# Patient Record
Sex: Male | Born: 1935 | Race: Black or African American | Hispanic: No | Marital: Married | Smoking: Former smoker
Health system: Southern US, Community
[De-identification: ages and names within clinical notes are randomized; demographics above are authoritative.]

## PROBLEM LIST (undated history)

## (undated) DIAGNOSIS — I10 Essential (primary) hypertension: Secondary | ICD-10-CM

## (undated) DIAGNOSIS — H548 Legal blindness, as defined in USA: Secondary | ICD-10-CM

## (undated) DIAGNOSIS — H409 Unspecified glaucoma: Secondary | ICD-10-CM

## (undated) DIAGNOSIS — E119 Type 2 diabetes mellitus without complications: Secondary | ICD-10-CM

## (undated) DIAGNOSIS — I639 Cerebral infarction, unspecified: Secondary | ICD-10-CM

## (undated) HISTORY — PX: CATARACT EXTRACTION: SUR2

---

## 2006-03-13 ENCOUNTER — Emergency Department: Payer: Self-pay | Admitting: Emergency Medicine

## 2006-03-13 ENCOUNTER — Other Ambulatory Visit: Payer: Self-pay

## 2006-11-30 ENCOUNTER — Emergency Department: Payer: Self-pay | Admitting: Emergency Medicine

## 2006-11-30 ENCOUNTER — Other Ambulatory Visit: Payer: Self-pay

## 2006-12-07 ENCOUNTER — Emergency Department: Payer: Self-pay | Admitting: Unknown Physician Specialty

## 2006-12-07 ENCOUNTER — Other Ambulatory Visit: Payer: Self-pay

## 2006-12-11 ENCOUNTER — Ambulatory Visit: Payer: Self-pay | Admitting: Unknown Physician Specialty

## 2016-04-29 ENCOUNTER — Emergency Department: Payer: Medicare Other

## 2016-04-29 ENCOUNTER — Encounter: Payer: Self-pay | Admitting: Emergency Medicine

## 2016-04-29 ENCOUNTER — Emergency Department
Admission: EM | Admit: 2016-04-29 | Discharge: 2016-04-29 | Disposition: A | Discharge status: Home / Self Care | Payer: Medicare Other | Attending: Emergency Medicine | Admitting: Emergency Medicine

## 2016-04-29 DIAGNOSIS — Y9389 Activity, other specified: Secondary | ICD-10-CM | POA: Diagnosis not present

## 2016-04-29 DIAGNOSIS — Z87891 Personal history of nicotine dependence: Secondary | ICD-10-CM | POA: Insufficient documentation

## 2016-04-29 DIAGNOSIS — E119 Type 2 diabetes mellitus without complications: Secondary | ICD-10-CM | POA: Diagnosis not present

## 2016-04-29 DIAGNOSIS — S0083XA Contusion of other part of head, initial encounter: Secondary | ICD-10-CM

## 2016-04-29 DIAGNOSIS — I10 Essential (primary) hypertension: Secondary | ICD-10-CM | POA: Diagnosis not present

## 2016-04-29 DIAGNOSIS — M542 Cervicalgia: Secondary | ICD-10-CM | POA: Insufficient documentation

## 2016-04-29 DIAGNOSIS — S0001XA Abrasion of scalp, initial encounter: Secondary | ICD-10-CM

## 2016-04-29 DIAGNOSIS — S0990XA Unspecified injury of head, initial encounter: Secondary | ICD-10-CM | POA: Diagnosis present

## 2016-04-29 DIAGNOSIS — W06XXXA Fall from bed, initial encounter: Secondary | ICD-10-CM | POA: Insufficient documentation

## 2016-04-29 DIAGNOSIS — Y92009 Unspecified place in unspecified non-institutional (private) residence as the place of occurrence of the external cause: Secondary | ICD-10-CM | POA: Diagnosis not present

## 2016-04-29 DIAGNOSIS — Y999 Unspecified external cause status: Secondary | ICD-10-CM | POA: Diagnosis not present

## 2016-04-29 HISTORY — DX: Type 2 diabetes mellitus without complications: E11.9

## 2016-04-29 HISTORY — DX: Essential (primary) hypertension: I10

## 2016-04-29 HISTORY — DX: Unspecified glaucoma: H40.9

## 2016-04-29 HISTORY — DX: Cerebral infarction, unspecified: I63.9

## 2016-04-29 HISTORY — DX: Legal blindness, as defined in USA: H54.8

## 2016-04-29 MED ORDER — TETANUS-DIPHTH-ACELL PERTUSSIS 5-2.5-18.5 LF-MCG/0.5 IM SUSP
0.5000 mL | Freq: Once | INTRAMUSCULAR | Status: AC
  Administered 2016-04-29: 0.5 mL via INTRAMUSCULAR
  Filled 2016-04-29: qty 0.5

## 2016-04-29 NOTE — ED Provider Notes (Signed)
[facility] Emergency Department Provider Note  ____________________________________________  Time seen: Approximately 4:48 PM  I have reviewed the triage vital signs and the nursing notes.   HISTORY  Chief Complaint Fall; Head Injury; and Neck Pain Level 5 caveat:  Portions of the history and physical were unable to be obtained due to the patient's poor historian   HPI Tyrone Hudson is a 82 y.o. male brought to the ED due to unwitnessed fall. He was getting out of his hospital bed at home when he had an apparent fall. He yelled out and his wife went to his side and found that he had fallen and suffered an abrasion to his forehead. Patient complains only of pain in the neck.     Past Medical History:  Diagnosis Date  . Diabetes mellitus without complication (HCC)   . Glaucoma   . Hypertension   . Legally blind   . Stroke [facility](HCC)      There are no active problems to display for this patient.    Past Surgical History:  Procedure Laterality Date  . CATARACT EXTRACTION       Prior to Admission medications   Not on File  No blood thinners   Allergies Benadryl [diphenhydramine hcl (sleep)]   History reviewed. No pertinent family history.  Social History Social History  Substance Use Topics  . Smoking status: Former Games developermoker  . Smokeless tobacco: Never Used  . Alcohol use No    Review of Systems  Constitutional:   No fever or chills.  ENT:   No sore throat. No rhinorrhea. Cardiovascular:   No chest pain. Respiratory:   No dyspnea or cough. Gastrointestinal:   Negative for abdominal pain, vomiting and diarrhea.  Genitourinary:   Negative for dysuria or difficulty urinating. Musculoskeletal:   Positive neck pain Neurological:   Positive forehead pain 10-point ROS otherwise negative.  ____________________________________________   PHYSICAL EXAM:  VITAL SIGNS: ED Triage Vitals [04/29/16 1509]  Enc Vitals Group     BP (!)  161/75     Pulse Rate 70     Resp 20     Temp 97.7 F (36.5 C)     Temp Source Oral     SpO2 98 %     Weight 165 lb (74.8 kg)     Height 5\' 6"  (1.676 m)     Head Circumference      Peak Flow      Pain Score      Pain Loc      Pain Edu?      Excl. in GC?     Vital signs reviewed, nursing assessments reviewed.   Constitutional:   Alert and oriented. Well appearing and in no distress. I doubt bedbugs, the patient's pajamas Eyes:   No scleral icterus. No conjunctival pallor. PERRL. EOMI.  No nystagmus. ENT   Head:   Normocephalic with superficial abrasion in the midline over the forehead.   Nose:   No congestion/rhinnorhea. No septal hematoma. There is a small 1-2 mm superficial laceration over the bridge of the nose, not gaping, hemostatic. Not requiring repair   Mouth/Throat:   MMM, no pharyngeal erythema. No peritonsillar mass.    Neck:   No stridor. No SubQ emphysema. No meningismus. Diffuse midline tenderness Hematological/Lymphatic/Immunilogical:   No cervical lymphadenopathy. Cardiovascular:   RRR. Symmetric bilateral radial and DP pulses.  No murmurs.  Respiratory:   Normal respiratory effort without tachypnea nor retractions. Breath sounds are clear and equal bilaterally. No  wheezes/rales/rhonchi. Gastrointestinal:   Soft and nontender. Non distended. There is no CVA tenderness.  No rebound, rigidity, or guarding. Genitourinary:   deferred Musculoskeletal:   Nontender with normal range of motion in all extremities. No joint effusions.  No lower extremity tenderness.  No edema. Positive midline cervical spine tenderness. No other spinal tenderness. Neurologic:   Normal speech and language.  CN 2-10 normal. Motor grossly intact. No gross focal neurologic deficits are appreciated.  Skin:    Skin is warm, dry and intact. No rash noted.  No petechiae, purpura, or bullae.  ____________________________________________    LABS (pertinent positives/negatives) (all  labs ordered are listed, but only abnormal results are displayed) Labs Reviewed - No data to display ____________________________________________   EKG  Interpreted by me Sinus rhythm rate of 71, normal axis and intervals. Normal QRS ST segments and T waves. There are voltage criteria for LVH in the high lateral leads.  ____________________________________________    RADIOLOGY  CT head unremarkable CT cervical spine unremarkable  ____________________________________________   PROCEDURES Procedures  ____________________________________________   INITIAL IMPRESSION / ASSESSMENT AND PLAN / ED COURSE  Pertinent labs & imaging results that were available during my care of the patient were reviewed by me and considered in my medical decision making (see chart for details).  Patient well appearing no acute distress. Presents with isolated head trauma with some neck pain. With this location of injury there is a risk of extension injury to the C-spine. Collar placed. CT ordered.    ----------------------------------------- 5:23 PM on 04/29/2016 -----------------------------------------  Workup negative. Patient at baseline. We'll discharge home follow up with primary care.   Clinical Course    ____________________________________________   FINAL CLINICAL IMPRESSION(S) / ED DIAGNOSES  Final diagnoses:  Contusion of face, initial encounter  Abrasion of scalp, initial encounter       Portions of this note were generated with dragon dictation software. Dictation errors may occur despite best attempts at proofreading.    Sharman CheekPhillip Makelle Marrone, MD 04/29/16 1723

## 2016-04-29 NOTE — ED Triage Notes (Signed)
Pt presents to ED via POV. Per pt's wife pt got out of bed, had a "dizzy spell" and fell. Pt's wife reports she found him face down on the floor. Pt presents with abrasion to the top of his head, one of which covered with abx ointment.

## 2016-04-29 NOTE — ED Notes (Signed)
Patient and care giver given instructions regarding follow-up.  Patient's care giver verbalized understanding.

## 2016-04-29 NOTE — Discharge Instructions (Signed)
Your CT scans of the head and neck were unremarkable. Follow up with your primary care doctor for continued monitoring of your symptoms.

## 2019-07-18 ENCOUNTER — Inpatient Hospital Stay
Admission: EM | Admit: 2019-07-18 | Discharge: 2019-07-21 | DRG: 871 | Disposition: A | Discharge status: Hospice | Payer: Medicare Other | Attending: Internal Medicine | Admitting: Internal Medicine

## 2019-07-18 ENCOUNTER — Emergency Department: Payer: Medicare Other

## 2019-07-18 ENCOUNTER — Other Ambulatory Visit: Payer: Self-pay

## 2019-07-18 DIAGNOSIS — I10 Essential (primary) hypertension: Secondary | ICD-10-CM | POA: Diagnosis present

## 2019-07-18 DIAGNOSIS — A419 Sepsis, unspecified organism: Secondary | ICD-10-CM | POA: Diagnosis present

## 2019-07-18 DIAGNOSIS — H409 Unspecified glaucoma: Secondary | ICD-10-CM | POA: Diagnosis present

## 2019-07-18 DIAGNOSIS — Z515 Encounter for palliative care: Secondary | ICD-10-CM | POA: Diagnosis present

## 2019-07-18 DIAGNOSIS — Z7189 Other specified counseling: Secondary | ICD-10-CM | POA: Diagnosis not present

## 2019-07-18 DIAGNOSIS — Z87891 Personal history of nicotine dependence: Secondary | ICD-10-CM | POA: Diagnosis not present

## 2019-07-18 DIAGNOSIS — N390 Urinary tract infection, site not specified: Secondary | ICD-10-CM | POA: Diagnosis present

## 2019-07-18 DIAGNOSIS — R64 Cachexia: Secondary | ICD-10-CM | POA: Diagnosis present

## 2019-07-18 DIAGNOSIS — R6521 Severe sepsis with septic shock: Secondary | ICD-10-CM | POA: Diagnosis present

## 2019-07-18 DIAGNOSIS — H9190 Unspecified hearing loss, unspecified ear: Secondary | ICD-10-CM | POA: Diagnosis present

## 2019-07-18 DIAGNOSIS — H548 Legal blindness, as defined in USA: Secondary | ICD-10-CM | POA: Diagnosis present

## 2019-07-18 DIAGNOSIS — U071 COVID-19: Secondary | ICD-10-CM | POA: Diagnosis present

## 2019-07-18 DIAGNOSIS — R7881 Bacteremia: Secondary | ICD-10-CM | POA: Diagnosis not present

## 2019-07-18 DIAGNOSIS — R6520 Severe sepsis without septic shock: Secondary | ICD-10-CM | POA: Diagnosis not present

## 2019-07-18 DIAGNOSIS — A4159 Other Gram-negative sepsis: Principal | ICD-10-CM | POA: Diagnosis present

## 2019-07-18 DIAGNOSIS — D696 Thrombocytopenia, unspecified: Secondary | ICD-10-CM | POA: Diagnosis not present

## 2019-07-18 DIAGNOSIS — Z8673 Personal history of transient ischemic attack (TIA), and cerebral infarction without residual deficits: Secondary | ICD-10-CM

## 2019-07-18 DIAGNOSIS — D6959 Other secondary thrombocytopenia: Secondary | ICD-10-CM | POA: Diagnosis present

## 2019-07-18 DIAGNOSIS — B961 Klebsiella pneumoniae [K. pneumoniae] as the cause of diseases classified elsewhere: Secondary | ICD-10-CM

## 2019-07-18 DIAGNOSIS — I248 Other forms of acute ischemic heart disease: Secondary | ICD-10-CM | POA: Diagnosis present

## 2019-07-18 DIAGNOSIS — Z66 Do not resuscitate: Secondary | ICD-10-CM | POA: Diagnosis present

## 2019-07-18 DIAGNOSIS — N179 Acute kidney failure, unspecified: Secondary | ICD-10-CM | POA: Diagnosis present

## 2019-07-18 DIAGNOSIS — E119 Type 2 diabetes mellitus without complications: Secondary | ICD-10-CM | POA: Diagnosis present

## 2019-07-18 DIAGNOSIS — R000 Tachycardia, unspecified: Secondary | ICD-10-CM | POA: Diagnosis not present

## 2019-07-18 LAB — BLOOD CULTURE ID PANEL (REFLEXED)

## 2019-07-18 LAB — COMPREHENSIVE METABOLIC PANEL
ALT: 28 U/L (ref 0–44)
AST: 56 U/L — ABNORMAL HIGH (ref 15–41)
Albumin: 3.6 g/dL (ref 3.5–5.0)
Alkaline Phosphatase: 74 U/L (ref 38–126)
Anion gap: 18 — ABNORMAL HIGH (ref 5–15)
BUN: 19 mg/dL (ref 8–23)
CO2: 25 mmol/L (ref 22–32)
Calcium: 9.2 mg/dL (ref 8.9–10.3)
Chloride: 100 mmol/L (ref 98–111)
Creatinine, Ser: 2.17 mg/dL — ABNORMAL HIGH (ref 0.61–1.24)
GFR calc Af Amer: 29 mL/min — ABNORMAL LOW (ref >60)
GFR calc non Af Amer: 25 mL/min — ABNORMAL LOW (ref >60)
Glucose, Bld: 169 mg/dL — ABNORMAL HIGH (ref 70–99)
Potassium: 4.3 mmol/L (ref 3.5–5.1)
Sodium: 143 mmol/L (ref 135–145)
Total Bilirubin: 0.8 mg/dL (ref 0.3–1.2)
Total Protein: 7.3 g/dL (ref 6.5–8.1)

## 2019-07-18 LAB — URINALYSIS, COMPLETE (UACMP) WITH MICROSCOPIC
Bilirubin Urine: NEGATIVE
Glucose, UA: NEGATIVE mg/dL
Ketones, ur: NEGATIVE mg/dL
Nitrite: NEGATIVE
Protein, ur: 30 mg/dL — AB
RBC / HPF: >50 RBC/hpf — ABNORMAL HIGH (ref 0–5)
Specific Gravity, Urine: 1.006 (ref 1.005–1.030)
WBC, UA: >50 WBC/hpf — ABNORMAL HIGH (ref 0–5)
pH: 6 (ref 5.0–8.0)

## 2019-07-18 LAB — CBC WITH DIFFERENTIAL/PLATELET
Abs Immature Granulocytes: 0.01 10*3/uL (ref 0.00–0.07)
Basophils Absolute: 0 10*3/uL (ref 0.0–0.1)
Basophils Relative: 0 %
Eosinophils Absolute: 0 10*3/uL (ref 0.0–0.5)
Eosinophils Relative: 0 %
HCT: 47.1 % (ref 39.0–52.0)
Hemoglobin: 15 g/dL (ref 13.0–17.0)
Immature Granulocytes: 0 %
Lymphocytes Relative: 16 %
Lymphs Abs: 0.5 10*3/uL — ABNORMAL LOW (ref 0.7–4.0)
MCH: 30.1 pg (ref 26.0–34.0)
MCHC: 31.8 g/dL (ref 30.0–36.0)
MCV: 94.6 fL (ref 80.0–100.0)
Monocytes Absolute: 0 10*3/uL — ABNORMAL LOW (ref 0.1–1.0)
Monocytes Relative: 0 %
Neutro Abs: 2.7 10*3/uL (ref 1.7–7.7)
Neutrophils Relative %: 84 %
Platelets: 84 10*3/uL — ABNORMAL LOW (ref 150–400)
RBC: 4.98 MIL/uL (ref 4.22–5.81)
RDW: 13.7 % (ref 11.5–15.5)
Smear Review: NORMAL
WBC: 3.2 10*3/uL — ABNORMAL LOW (ref 4.0–10.5)
nRBC: 0 % (ref 0.0–0.2)

## 2019-07-18 LAB — TROPONIN I (HIGH SENSITIVITY)
Troponin I (High Sensitivity): 956 ng/L (ref <18)
Troponin I (High Sensitivity): 1065 ng/L (ref <18)
Troponin I (High Sensitivity): 1110 ng/L (ref <18)

## 2019-07-18 LAB — LACTIC ACID, PLASMA
Lactic Acid, Venous: >11 mmol/L (ref 0.5–1.9)
Lactic Acid, Venous: 9.4 mmol/L (ref 0.5–1.9)
Lactic Acid, Venous: 8.5 mmol/L (ref 0.5–1.9)
Lactic Acid, Venous: 7.3 mmol/L (ref 0.5–1.9)

## 2019-07-18 LAB — PROTIME-INR
INR: 1.2 (ref 0.8–1.2)
Prothrombin Time: 14.6 seconds (ref 11.4–15.2)

## 2019-07-18 LAB — MRSA PCR SCREENING: MRSA by PCR: NEGATIVE

## 2019-07-18 LAB — PROCALCITONIN: Procalcitonin: 26.52 ng/mL

## 2019-07-18 MED ORDER — ACETAMINOPHEN 650 MG RE SUPP: 650.0000 mg | Freq: Four times a day (QID) | RECTAL | Status: DC | PRN

## 2019-07-18 MED ORDER — LACTATED RINGERS IV BOLUS (SEPSIS)
1000.0000 mL | Freq: Once | INTRAVENOUS | Status: AC
  Administered 2019-07-18: 1000 mL via INTRAVENOUS

## 2019-07-18 MED ORDER — HEPARIN SODIUM (PORCINE) 5000 UNIT/ML IJ SOLN
5000.0000 [IU] | Freq: Three times a day (TID) | INTRAMUSCULAR | Status: DC
  Administered 2019-07-18: 22:00:00 5000 [IU] via SUBCUTANEOUS
  Filled 2019-07-18: qty 1

## 2019-07-18 MED ORDER — LACTATED RINGERS IV SOLN: INTRAVENOUS | Status: DC

## 2019-07-18 MED ORDER — CHLORHEXIDINE GLUCONATE CLOTH 2 % EX PADS
6.0000 | MEDICATED_PAD | Freq: Every day | CUTANEOUS | Status: DC
  Administered 2019-07-18 – 2019-07-19 (×2): 6 via TOPICAL

## 2019-07-18 MED ORDER — NOREPINEPHRINE 4 MG/250ML-% IV SOLN
0.0000 ug/min | INTRAVENOUS | Status: DC
  Administered 2019-07-18: 26 ug/min via INTRAVENOUS
  Administered 2019-07-18: 22:00:00 28 ug/min via INTRAVENOUS
  Administered 2019-07-18: 16:00:00 5 ug/min via INTRAVENOUS
  Administered 2019-07-19: 26 ug/min via INTRAVENOUS
  Administered 2019-07-19: 04:00:00 24 ug/min via INTRAVENOUS
  Filled 2019-07-18 (×5): qty 250
  Filled 2019-07-18: qty 500

## 2019-07-18 MED ORDER — LACTATED RINGERS IV BOLUS
1000.0000 mL | Freq: Once | INTRAVENOUS | Status: AC
  Administered 2019-07-18: 14:00:00 1000 mL via INTRAVENOUS

## 2019-07-18 MED ORDER — SODIUM CHLORIDE 0.9 % IV SOLN
2.0000 g | INTRAVENOUS | Status: DC
  Administered 2019-07-19: 06:00:00 2 g via INTRAVENOUS
  Filled 2019-07-18: qty 20

## 2019-07-18 MED ORDER — ACETAMINOPHEN 650 MG RE SUPP
650.0000 mg | Freq: Once | RECTAL | Status: AC
  Administered 2019-07-18: 13:00:00 650 mg via RECTAL
  Filled 2019-07-18: qty 1

## 2019-07-18 MED ORDER — SODIUM CHLORIDE 0.9 % IV SOLN
1.0000 g | INTRAVENOUS | Status: DC
  Administered 2019-07-18: 12:00:00 1 g via INTRAVENOUS
  Filled 2019-07-18 (×2): qty 10

## 2019-07-18 MED ORDER — METOPROLOL TARTRATE 5 MG/5ML IV SOLN
5.0000 mg | Freq: Once | INTRAVENOUS | Status: AC
  Administered 2019-07-18: 14:00:00 5 mg via INTRAVENOUS
  Filled 2019-07-18: qty 5

## 2019-07-18 MED ORDER — METOPROLOL TARTRATE 5 MG/5ML IV SOLN
5.0000 mg | Freq: Once | INTRAVENOUS | Status: AC
  Administered 2019-07-18: 5 mg via INTRAVENOUS
  Filled 2019-07-18: qty 5

## 2019-07-18 MED ORDER — LACTATED RINGERS IV BOLUS (SEPSIS)
1000.0000 mL | Freq: Once | INTRAVENOUS | Status: AC
Start: 2019-07-18 — End: 2019-07-18
  Administered 2019-07-18: 12:00:00 1000 mL via INTRAVENOUS

## 2019-07-18 MED ORDER — LACTATED RINGERS IV BOLUS
1000.0000 mL | Freq: Once | INTRAVENOUS | Status: AC
  Administered 2019-07-18: 1000 mL via INTRAVENOUS

## 2019-07-18 MED ORDER — ACETAMINOPHEN 325 MG PO TABS: 650.0000 mg | ORAL_TABLET | Freq: Four times a day (QID) | ORAL | Status: DC | PRN

## 2019-07-18 MED ORDER — LACTATED RINGERS IV BOLUS (SEPSIS)
500.0000 mL | Freq: Once | INTRAVENOUS | Status: AC
  Administered 2019-07-18: 13:00:00 500 mL via INTRAVENOUS

## 2019-07-18 NOTE — ED Notes (Signed)
Ceftriaxone full dose given

## 2019-07-18 NOTE — ED Provider Notes (Signed)
[facility] Emergency Department Provider Note    First MD Initiated Contact with Patient 07/18/19 1119     (approximate)  I have reviewed the triage vital signs and the nursing notes.   HISTORY  Chief Complaint Altered Mental Status and Weakness  Level V Caveat:  AMS  HPI Tyrone Hudson is a 86 y.o. male plosive past medical history presents to the ER with altered mental status.  Per family and EMS report patient has been increasingly confused with past several days.  Has not had anything to eat or drink in 2-3.  Reportedly having difficulty swallowing for the past several.  Patient also reporting burning when he urinates over the past 2 to 3 days.  Arrives to the ER critically ill-appearing tachycardic frail and almost cachectic appearing    Past Medical History:  Diagnosis Date  . Diabetes mellitus without complication (HCC)   . Glaucoma   . Hypertension   . Legally blind   . Stroke [facility])    No family history on file. Past Surgical History:  Procedure Laterality Date  . CATARACT EXTRACTION     There are no problems to display for this patient.     Prior to Admission medications   Not on File    Allergies Benadryl [diphenhydramine hcl (sleep)]    Social History Social History   Tobacco Use  . Smoking status: Former Games developer  . Smokeless tobacco: Never Used  Substance Use Topics  . Alcohol use: No  . Drug use: No    Review of Systems Patient denies headaches, rhinorrhea, blurry vision, numbness, shortness of breath, chest pain, edema, cough, abdominal pain, nausea, vomiting, diarrhea, dysuria, fevers, rashes or hallucinations unless otherwise stated above in HPI. ____________________________________________   PHYSICAL EXAM:  VITAL SIGNS: Vitals:   07/18/19 1346 07/18/19 1348  BP: (!) 87/64 (!) 89/66  Pulse: (!) 137 (!) 136  Resp: (!) 24 (!) 24  Temp:    SpO2: 99% 99%    Constitutional: enecphalopathic Eyes:  Conjunctivae are normal.  Head: Atraumatic. Nose: No congestion/rhinnorhea. Mouth/Throat: Mucous membranes are moist.   Neck: No stridor. Painless ROM.  Cardiovascular: tachycardic, regular rhythm. Grossly normal heart sounds.  Delayed cap refill Respiratory: tachypnea, coarse bibasilar breath sounds. Gastrointestinal: Soft and nontender. No distention. No abdominal bruits. No CVA tenderness. Genitourinary: normal external genitalia Musculoskeletal: No lower extremity tenderness nor edema.  No joint effusions. Neurologic:   Unable to cooperate with neuro exam. No facial droop Skin:  Skin is cool dry and intact. No rash noted. Psychiatric: unable to assess  ____________________________________________   LABS (all labs ordered are listed, but only abnormal results are displayed)  Results for orders placed or performed during the hospital encounter of 07/18/19 (from the past 24 hour(s))  Comprehensive metabolic panel     Status: Abnormal   Collection Time: 07/18/19 11:20 AM  Result Value Ref Range   Sodium 143 135 - 145 mmol/L   Potassium 4.3 3.5 - 5.1 mmol/L   Chloride 100 98 - 111 mmol/L   CO2 25 22 - 32 mmol/L   Glucose, Bld 169 (H) 70 - 99 mg/dL   BUN 19 8 - 23 mg/dL   Creatinine, Ser 5.88 (H) 0.61 - 1.24 mg/dL   Calcium 9.2 8.9 - 50.2 mg/dL   Total Protein 7.3 6.5 - 8.1 g/dL   Albumin 3.6 3.5 - 5.0 g/dL   AST 56 (H) 15 - 41 U/L   ALT 28 0 - 44 U/L  Alkaline Phosphatase 74 38 - 126 U/L   Total Bilirubin 0.8 0.3 - 1.2 mg/dL   GFR calc non Af Amer 25 (L) >60 mL/min   GFR calc Af Amer 29 (L) >60 mL/min   Anion gap 18 (H) 5 - 15  CBC with Differential     Status: Abnormal   Collection Time: 07/18/19 11:20 AM  Result Value Ref Range   WBC 3.2 (L) 4.0 - 10.5 K/uL   RBC 4.98 4.22 - 5.81 MIL/uL   Hemoglobin 15.0 13.0 - 17.0 g/dL   HCT 33.2 95.1 - 88.4 %   MCV 94.6 80.0 - 100.0 fL   MCH 30.1 26.0 - 34.0 pg   MCHC 31.8 30.0 - 36.0 g/dL   RDW 16.6 06.3 - 01.6 %   Platelets  84 (L) 150 - 400 K/uL   nRBC 0.0 0.0 - 0.2 %   Neutrophils Relative % 84 %   Neutro Abs 2.7 1.7 - 7.7 K/uL   Lymphocytes Relative 16 %   Lymphs Abs 0.5 (L) 0.7 - 4.0 K/uL   Monocytes Relative 0 %   Monocytes Absolute 0.0 (L) 0.1 - 1.0 K/uL   Eosinophils Relative 0 %   Eosinophils Absolute 0.0 0.0 - 0.5 K/uL   Basophils Relative 0 %   Basophils Absolute 0.0 0.0 - 0.1 K/uL   WBC Morphology MORPHOLOGY UNREMARKABLE    Smear Review Normal platelet morphology    Immature Granulocytes 0 %   Abs Immature Granulocytes 0.01 0.00 - 0.07 K/uL   Burr Cells PRESENT   Urinalysis, Complete w Microscopic     Status: Abnormal   Collection Time: 07/18/19 11:20 AM  Result Value Ref Range   Color, Urine YELLOW (A) YELLOW   APPearance HAZY (A) CLEAR   Specific Gravity, Urine 1.006 1.005 - 1.030   pH 6.0 5.0 - 8.0   Glucose, UA NEGATIVE NEGATIVE mg/dL   Hgb urine dipstick LARGE (A) NEGATIVE   Bilirubin Urine NEGATIVE NEGATIVE   Ketones, ur NEGATIVE NEGATIVE mg/dL   Protein, ur 30 (A) NEGATIVE mg/dL   Nitrite NEGATIVE NEGATIVE   Leukocytes,Ua LARGE (A) NEGATIVE   RBC / HPF >50 (H) 0 - 5 RBC/hpf   WBC, UA >50 (H) 0 - 5 WBC/hpf   Bacteria, UA MANY (A) NONE SEEN   Squamous Epithelial / LPF 0-5 0 - 5   WBC Clumps PRESENT    Mucus PRESENT   Procalcitonin     Status: None   Collection Time: 07/18/19 11:20 AM  Result Value Ref Range   Procalcitonin 26.52 ng/mL  Lactic acid, plasma     Status: Abnormal   Collection Time: 07/18/19 12:21 PM  Result Value Ref Range   Lactic Acid, Venous >11.0 (HH) 0.5 - 1.9 mmol/L  Protime-INR     Status: None   Collection Time: 07/18/19 12:21 PM  Result Value Ref Range   Prothrombin Time 14.6 11.4 - 15.2 seconds   INR 1.2 0.8 - 1.2   ____________________________________________  EKG My review and personal interpretation at Time: 11:26   Indication: sepsis  Rate: 160  Rhythm: sinus tach Axis: normal Other: non specific st abn, no stemi  ____________________________________________  RADIOLOGY  I personally reviewed all radiographic images ordered to evaluate for the above acute complaints and reviewed radiology reports and findings.  These findings were personally discussed with the patient.  Please see medical record for radiology report.  ____________________________________________   PROCEDURES  Procedure(s) performed:  .Critical Care Performed by: Willy Eddy, MD  Authorized by: Merlyn Lot, MD   Critical care provider statement:    Critical care time (minutes):  40   Critical care was necessary to treat or prevent imminent or life-threatening deterioration of the following conditions:  Shock   Critical care was time spent personally by me on the following activities:  Discussions with consultants, evaluation of patient's response to treatment, examination of patient, ordering and performing treatments and interventions, ordering and review of laboratory studies, ordering and review of radiographic studies, pulse oximetry, re-evaluation of patient's condition, obtaining history from patient or surrogate and review of old charts      Critical Care performed: yes ____________________________________________   INITIAL IMPRESSION / ASSESSMENT AND PLAN / ED COURSE  Pertinent labs & imaging results that were available during my care of the patient were reviewed by me and considered in my medical decision making (see chart for details).   DDX: Dehydration, sepsis, pna, uti, hypoglycemia, cva, drug effect, withdrawal, encephalitis   Tyrone Hudson is a 86 y.o. who presents to the ED with symptoms as described above.  Patient critically ill-appearing frail appears septic.  Medical be sent for by differential.  Will order IV fluid for resuscitation.  Will order IV antibiotics.  His urine does appear grossly purulent.  Clinical Course as of Jul 17 1399  Sat Jul 18, 2019  1218 Tachycardia is improving.   Patient has grossly infected urine.  Does have evidence of AKI.  Will order CT to exclude septic stone   [PR]  1251 Patient reassessed.  Patient with evidence of septic shock.  Heart rate is improving.  Blood pressure stabilized.  He seems a little bit better perfused.  Discussed poor prognosis with the wife at bedside.  States that he would not want CPR or to be placed on life support.  CODE STATUS changed to DNR but they do want to continue with IV fluids and IV antibiotics but want to make sure that the goal is comfort.   [PR]  1359 We will continue with IV fluids.  Family agrees with plan for conservative management but agrees to IV fluids and IV antibiotics.  Patient discussed with hospitalist.  Prognosis remains exceedingly poor.   [PR]    Clinical Course User Index [PR] Merlyn Lot, MD    The patient was evaluated in Emergency Department today for the symptoms described in the history of present illness. He/she was evaluated in the context of the global COVID-19 pandemic, which necessitated consideration that the patient might be at risk for infection with the SARS-CoV-2 virus that causes COVID-19. Institutional protocols and algorithms that pertain to the evaluation of patients at risk for COVID-19 are in a state of rapid change based on information released by regulatory bodies including the CDC and federal and state organizations. These policies and algorithms were followed during the patient's care in the ED.  As part of my medical decision making, I reviewed the following data within the [place] notes reviewed and incorporated, Labs reviewed, notes from prior ED visits and [place] Controlled Substance Database   ____________________________________________   FINAL CLINICAL IMPRESSION(S) / ED DIAGNOSES  Final diagnoses:  Sepsis with acute organ dysfunction and septic shock, due to unspecified organism, unspecified type ([place])      NEW MEDICATIONS  STARTED DURING THIS VISIT:  New Prescriptions   No medications on file     Note:  This document was prepared using Dragon voice recognition software and may include unintentional dictation errors.  Willy Eddy, MD 07/18/19 [id number]

## 2019-07-18 NOTE — ED Notes (Signed)
Lab called and stated to recollect blur top and gray top - completed at this time

## 2019-07-18 NOTE — ED Notes (Signed)
Admitting provider to bedside and discussed comfort care with spouse - spouse still undecided and changing her mind and talking to family - admitting provider will be placing orders for lactated ringer bolus and levophed

## 2019-07-18 NOTE — ED Notes (Signed)
Dr Roxan Hockey notified of critical lactic acid of >11.0 - no new orders at this time

## 2019-07-18 NOTE — Progress Notes (Signed)
CODE SEPSIS - PHARMACY COMMUNICATION  **Broad Spectrum Antibiotics should be administered within 1 hour of Sepsis diagnosis**  Time Code Sepsis Called/Page Received: 1128  Antibiotics Ordered: CTX   Time of 1st antibiotic administration: 1159  Additional action taken by pharmacy: n/a   Tressie Ellis  Pharmacy Resident 07/18/2019  11:50 AM

## 2019-07-18 NOTE — H&P (Signed)
History and Physical    Tyrone Hudson KVQ:[id number] DOB: [birth year] DOA: 07/18/2019    [facility]   Patient coming from: Home  Chief Complaint: Severe Sepsis.  HPI: Tyrone Hudson is a 86 y.o. male with medical history significant of DM II / HTN / stroke  With wife at bedside, Wife says he did not tell her he was feeling bad. But he has been eating poorly for about a week. He has not bee able to swallow a month and not take meds in apple sauce. Per wife he needs total care at home. Pt has an aid at home.  ED Course:  Blood pressure (!) 82/68, pulse (!) 118, temperature 99.9 F (37.7 C), temperature source Axillary, resp. rate (!) 21, height 5\' 7"  (1.702 m), weight 81.6 kg, SpO2 96 %. In Ed pt received 2 and half liter bolus of ivf and rocephin. Pt also received metoprolol prn for his tachycardia.   Review of Systems: As per HPI otherwise 10 point review of systems negative.    Past Medical History:  Diagnosis Date  . Diabetes mellitus without complication ([place])   . Glaucoma   . Hypertension   . Legally blind   . Stroke [facility])     Past Surgical History:  Procedure Laterality Date  . CATARACT EXTRACTION       reports that he has quit smoking. He has never used smokeless tobacco. He reports that he does not drink alcohol or use drugs.  Allergies  Allergen Reactions  . Benadryl [Diphenhydramine Hcl (Sleep)] Rash    History reviewed. No pertinent family history.    Prior to Admission medications   Not on File    Physical Exam: Vitals:   07/18/19 1645 07/18/19 1700 07/18/19 1715 07/18/19 1800  BP: (!) 79/56 (!) 89/56 (!) 84/56 (!) 82/68  Pulse: (!) 120 (!) 123 (!) 120 (!) 118  Resp:      Temp:    99.9 F (37.7 C)  TempSrc:    Axillary  SpO2: 100% 100% (!) 89% 96%  Weight:      Height:    5\' 7"  (1.702 m)      Constitutional: NAD, calm, comfortable Vitals:   07/18/19 1645 07/18/19 1700 07/18/19 1715 07/18/19 1800  BP: (!) 79/56  (!) 89/56 (!) 84/56 (!) 82/68  Pulse: (!) 120 (!) 123 (!) 120 (!) 118  Resp:      Temp:    99.9 F (37.7 C)  TempSrc:    Axillary  SpO2: 100% 100% (!) 89% 96%  Weight:      Height:    5\' 7"  (1.702 m)   Eyes: EOMI, bl opacification of both lenses, blind due tot end stage glaucoma. ENMT: Mucous membranes are dry. Posterior pharynx clear of any exudate or lesions.edentulous Neck: normal, supple, no masses, no thyromegaly Respiratory: clear to auscultation bilaterally, no wheezing, no crackles. Normal respiratory effort. No accessory muscle use.  Cardiovascular: Regular rate and rhythm, no murmurs / rubs / gallops. No extremity edema. 2+ pedal pulses. No carotid bruits.  Abdomen: no tenderness, no masses palpated. No hepatosplenomegaly. Bowel sounds positive.  Musculoskeletal: no clubbing / cyanosis. No joint deformity upper and lower extremities. Good ROM, no contractures. Normal muscle tone.  Skin: no rashes, lesions, ulcers. No induration Neurologic: CN 2-12 grossly intact. Pt moving all four ext.  Psychiatric:  Pt is alert and follows commands.  Labs on Admission: I have personally reviewed following labs and imaging studies  CBC: Recent Labs  Lab  07/18/19 1120  WBC 3.2*  NEUTROABS 2.7  HGB 15.0  HCT 47.1  MCV 94.6  PLT 84*   Basic Metabolic Panel: Recent Labs  Lab 07/18/19 1120  NA 143  K 4.3  CL 100  CO2 25  GLUCOSE 169*  BUN 19  CREATININE 2.17*  CALCIUM 9.2   GFR: Estimated Creatinine Clearance: 21.7 mL/min (A) (by C-G formula based on SCr of 2.17 mg/dL (H)). Liver Function Tests: Recent Labs  Lab 07/18/19 1120  AST 56*  ALT 28  ALKPHOS 74  BILITOT 0.8  PROT 7.3  ALBUMIN 3.6   No results for input(s): LIPASE, AMYLASE in the last 168 hours. No results for input(s): AMMONIA in the last 168 hours. Coagulation Profile: Recent Labs  Lab 07/18/19 1221  INR 1.2   Cardiac Enzymes: No results for input(s): CKTOTAL, CKMB, CKMBINDEX, TROPONINI in the last  168 hours. BNP (last 3 results) No results for input(s): PROBNP in the last 8760 hours. HbA1C: No results for input(s): HGBA1C in the last 72 hours. CBG: No results for input(s): GLUCAP in the last 168 hours. Lipid Profile: No results for input(s): CHOL, HDL, LDLCALC, TRIG, CHOLHDL, LDLDIRECT in the last 72 hours. Thyroid Function Tests: No results for input(s): TSH, T4TOTAL, FREET4, T3FREE, THYROIDAB in the last 72 hours. Anemia Panel: No results for input(s): VITAMINB12, FOLATE, FERRITIN, TIBC, IRON, RETICCTPCT in the last 72 hours. Urine analysis:    Component Value Date/Time   COLORURINE YELLOW (A) 07/18/2019 1120   APPEARANCEUR HAZY (A) 07/18/2019 1120   LABSPEC 1.006 07/18/2019 1120   PHURINE 6.0 07/18/2019 1120   GLUCOSEU NEGATIVE 07/18/2019 1120   HGBUR LARGE (A) 07/18/2019 1120   BILIRUBINUR NEGATIVE 07/18/2019 1120   KETONESUR NEGATIVE 07/18/2019 1120   PROTEINUR 30 (A) 07/18/2019 1120   NITRITE NEGATIVE 07/18/2019 1120   LEUKOCYTESUR LARGE (A) 07/18/2019 1120    Radiological Exams on Admission: DG Chest Portable 1 View  Result Date: 07/18/2019 CLINICAL DATA:  Cough, weakness, altered mental status EXAM: PORTABLE CHEST 1 VIEW COMPARISON:  None. FINDINGS: Normal heart size. Normal mediastinal contour. No pneumothorax. No pleural effusion. Lungs appear clear, with no acute consolidative airspace disease and no pulmonary edema. IMPRESSION: No active disease. Electronically Signed   By: Delbert Phenix M.D.   On: 07/18/2019 11:57   CT Renal Stone Study  Result Date: 07/18/2019 CLINICAL DATA:  Flank pain with kidney stone suspected EXAM: CT ABDOMEN AND PELVIS WITHOUT CONTRAST TECHNIQUE: Multidetector CT imaging of the abdomen and pelvis was performed following the standard protocol without IV contrast. COMPARISON:  None. FINDINGS: Lower chest: Gynecomastia on both sides. Emphysematous changes with reticulation in the lower lungs that is likely atelectasis or scarring. Coronary  calcification. Hepatobiliary: No focal liver abnormality.No evidence of biliary obstruction or stone. Pancreas: Unremarkable. Spleen: Unremarkable. Adrenals/Urinary Tract: Negative adrenals. No hydronephrosis or stone. Symmetric smooth renal atrophy. Moderate distension of the bladder. Stomach/Bowel: No obstruction. No evidence of inflammation. Mild distal colonic diverticulosis. Vascular/Lymphatic: No acute vascular abnormality. No mass or adenopathy. Reproductive:Marked prostate enlargement which is globular appearing. Presumed retraction of the right testicle into the inguinal canal, the scrotum is not completely covered. Other: No ascites or pneumoperitoneum. Motion degraded to the degree that findings could easily be obscured. Musculoskeletal: No acute abnormalities. Spondylosis and facet spurring. IMPRESSION: 1. Motion degraded CT without acute finding. No hydronephrosis or stone. 2. Marked enlargement of the prostate. Electronically Signed   By: Marnee Spring M.D.   On: 07/18/2019 13:41    EKG:  Assessment/Plan Principal Problem:   Severe sepsis (HCC) Pt has received total of 4.5 L of ivf rehydration BP was initially responding to ivf and now has become resistant and family and wife is uncertain about comfort care and children want everything to be done , we have therefore started pt on levophed thru peripheral iv. D/W wife at bedside ms betty that his [rognosis is guarded and will not recover from this most likely due to age and risk factors.  Pt to continue on levophed at 5-10 to keep MAP of 65.  Cultures obtained.  LR continued. NPO as pt is at risk for aspiration.  Indwelling foley as he has a large prostate.   Home meds held for his bp and dm as he is npo since past month and has swallowing issue per wife. Pt is unstable and if he survives this hospitalization we will proceed with swallow study.   DVT prophylaxis: Heparin.  Code Status: DNR Family Communication: Tyrone Hudson Wife [phone] Disposition Plan: TBD Consults called: None Admission status: Inpatient.  Gertha Calkin MD [facility] If 7PM-7AM, please contact night-coverage [email or web address] Password [facility]  07/18/2019, 6:09 PM

## 2019-07-18 NOTE — Progress Notes (Signed)
Pt's troponin has increased from 956 to 1065, no complaints of chest pain.  EKG obtained which shows borderline ST elevation in leads V2-V3, and minimal ST depression in lateral leads.  Will place on heparin drip.  Page sent out to Dr. Elease Hashimoto with Cardiology.  Awaiting callback.   Harlon Ditty, AGACNP-BC [place] [facility] Pager: [phone]

## 2019-07-18 NOTE — ED Notes (Signed)
Pt spouse requested this nurse to speak with her son - advised son of pt condition Applied ice packs to bilat underarms and groin - one sheet left to cover pt

## 2019-07-18 NOTE — ED Notes (Signed)
Pt drank cup of water 

## 2019-07-18 NOTE — ED Notes (Signed)
Admitting doctor request cooling blankets - advised provider that ED did not have these but could apply ice to groin and underarms and remove blankets - also gave VO for tylenol supp in 2 hours - admitting provider ask this nurse to tell the pt spouse that he was doing poorly and to ask her if she wanted the pt to be comfort care - discussed pt condition with spouse and she would like a few minutes to discuss POC with family

## 2019-07-18 NOTE — ED Notes (Signed)
Admitting provider messaged that pt temp was 103.4 rectally even after tylenol supp

## 2019-07-18 NOTE — Progress Notes (Signed)
Attempt was made to place Left Femoral CVC.  Able to successfully cannulate left femoral vein, however unable to thread guidewire.  Angle of entry was changed (successfully cannulated vessel) but still unable to thread guidewire.  Therefore attempt was aborted.  Pressure held at the site.  No signs of complications, Vital signs remain unchanged.  Continue to monitor.  Will assess for alternative access.   Harlon Ditty, AGACNP-BC [place] [facility] Pager: [phone]

## 2019-07-18 NOTE — ED Notes (Signed)
Pt BP appears hypotensive at this time - Dr Roxan Hockey notified

## 2019-07-18 NOTE — Progress Notes (Signed)
PHARMACY - PHYSICIAN COMMUNICATION CRITICAL VALUE ALERT - BLOOD CULTURE IDENTIFICATION (BCID)  Tyrone Hudson is an 86 y.o. male who presented to [facility] on 07/18/2019 with a chief complaint of failure to thrive.  Assessment:  Lab reports 4 of 4 bottles + for Klebsiella pneumoniae  Name of physician (or Provider) Contacted: Leanord Asal, NP  Current antibiotics: Rocephin 1gm IV q24hrs  Changes to prescribed antibiotics recommended: Increase to Rocephin 2gm per 24hrs Recommendations accepted by provider  Results for orders placed or performed during the hospital encounter of 07/18/19  Blood Culture ID Panel (Reflexed) (Collected: 07/18/2019 11:38 AM)  Result Value Ref Range   Enterococcus species NOT DETECTED NOT DETECTED   Listeria monocytogenes NOT DETECTED NOT DETECTED   Staphylococcus species NOT DETECTED NOT DETECTED   Staphylococcus aureus (BCID) NOT DETECTED NOT DETECTED   Streptococcus species NOT DETECTED NOT DETECTED   Streptococcus agalactiae NOT DETECTED NOT DETECTED   Streptococcus pneumoniae NOT DETECTED NOT DETECTED   Streptococcus pyogenes NOT DETECTED NOT DETECTED   Acinetobacter baumannii NOT DETECTED NOT DETECTED   Enterobacteriaceae species DETECTED (A) NOT DETECTED   Enterobacter cloacae complex NOT DETECTED NOT DETECTED   Escherichia coli NOT DETECTED NOT DETECTED   Klebsiella oxytoca NOT DETECTED NOT DETECTED   Klebsiella pneumoniae DETECTED (A) NOT DETECTED   Proteus species NOT DETECTED NOT DETECTED   Serratia marcescens NOT DETECTED NOT DETECTED   Carbapenem resistance NOT DETECTED NOT DETECTED   Haemophilus influenzae NOT DETECTED NOT DETECTED   Neisseria meningitidis NOT DETECTED NOT DETECTED   Pseudomonas aeruginosa NOT DETECTED NOT DETECTED   Candida albicans NOT DETECTED NOT DETECTED   Candida glabrata NOT DETECTED NOT DETECTED   Candida krusei NOT DETECTED NOT DETECTED   Candida parapsilosis NOT DETECTED NOT DETECTED   Candida tropicalis NOT  DETECTED NOT DETECTED    Valrie Hart A 07/18/2019  11:51 PM

## 2019-07-18 NOTE — Progress Notes (Signed)
Called and updated pt's wife Urho Rio on his status, that he is currently requiring high dose of Levophed running through PIV, and that he needs central line placement to safely infuse.  She gives consent for placement of central line.  Also updated her that pt's troponin is increasing, and will need Cardiology to evaluation.  She is appreciative of update.    Harlon Ditty, AGACNP-BC [place] [facility] Pager: [phone]

## 2019-07-18 NOTE — ED Triage Notes (Signed)
Pt arrived via EMS from home for report of weakness and altered mental status x2-3 days - pt is normally able to speak, A&O x4 - PT IS BLIND - family reports that pt has been "unable to swallow x2-3 months" so has not had any medications x2-3 months and they report no intake x2-3 days - urine reports to be concentrated - pt tachcardia and tachypnea

## 2019-07-18 NOTE — ED Notes (Signed)
VO for lactated ringers bolus obtained from Dr Roxan Hockey

## 2019-07-18 NOTE — ED Notes (Signed)
Attempted to call report and was advised that this pt is not stable enough for the regular floor - admitting provider messaged

## 2019-07-18 NOTE — Consult Note (Addendum)
 Name: Tyrone Hudson MRN: [id number] DOB: 08/13/1923    ADMISSION DATE:  07/18/2019 CONSULTATION DATE:  07/18/2019  REFERRING MD :  Elizabeth Ouma, NP   CHIEF COMPLAINT:  Hypotension  BRIEF PATIENT DESCRIPTION:  86 y.o. Male, DNR/DNI, admitted with septic shock secondary to UTI and Klebsiella pneumoniae bacteremia requiring vasopressors, AKI, and elevated troponin.  Cardiology consulted. Pt also is positive for COVID-19.  SIGNIFICANT EVENTS  2/20- Admitted to Stepdown unit 2/20- Hypotensive despite IVF resuscitation, vasopressors started 2/20- PCCM consulted 2/20- Elevated troponin, EKG with borderline ST elevation in V2-V3, Cardiology consulted  STUDIES:  2/20- CT Renal Stone Study>> 1. Motion degraded CT without acute finding. No hydronephrosis or Stone. 2. Marked enlargement of the prostate.  CULTURES: SARS-CoV-2 PCR 2/20>>POSITIVE Blood 2/20>>Klebsiella pneumoniae 4/4 bottles Urine 2/20>> MRSA PCR 2/20>>  ANTIBIOTICS: Ceftriaxone 2/20>>  HISTORY OF PRESENT ILLNESS:   Tyrone Hudson is a 86-year-old male with a past medical history notable for stroke, hypertension, diabetes mellitus 2 who presented to [facility] ED on 07/18/2019 due to poor p.o. intake for about a week.  His wife reports he has not been able to swallow well for about a month, and that he is requiring total care at home she has an aide.  Upon presentation to the ED his blood pressure was 82/68, heart rate 118, temperature 99.9, respiratory rate 21 and SPO2 96%.  Initial work-up in the ED revealed glucose 169, BUN 19, creatinine 2.17, anion gap 18, AST 56, lactic acid greater than 11, procalcitonin 26.52, WBC 3.2 (with lymphocytopenia), platelets 84, INR 1.2, prothrombin time 14.6.  His urinalysis is positive for urinary tract infection.  Chest x-ray was negative, CT renal stone study was negative.  He met sepsis criteria therefore he received IV fluid resuscitation and broad-spectrum antibiotics.  His blood pressures were  poorly responsive to IV fluids, he was subsequently placed on Levophed infusion. He was admitted to the stepdown unit by the hospitalist for further work-up and treatment of septic shock secondary to UTI.  His blood cultures are positive for Klebsiella pneumoniae and 4 out of 4 bottles.  His COVID-19 PCR is also positive.  High-sensitivity troponin 956.  Given his vasopressor requirements, PCCM was consulted for further work-up and treatment of septic shock secondary to UTI and Klebsiella pneumonia a bacteremia, AKI, elevated troponin, and COVID-19 infection..  Have discussed with his wife Tyrone Hudson, and she confirms he is a DNR/DNI.  Her and her children would like to continue with conservative measures at this time (IV antibiotics, IV fluids and vasopressors) to see if he is able to recover.  Palliative care is consulted to assist with goals of care.  PAST MEDICAL HISTORY :   has a past medical history of Diabetes mellitus without complication (HCC), Glaucoma, Hypertension, Legally blind, and Stroke (HCC).  has a past surgical history that includes Cataract extraction. Prior to Admission medications   Not on File   Allergies  Allergen Reactions  . Benadryl [Diphenhydramine Hcl (Sleep)] Rash    FAMILY HISTORY:  family history is not on file. SOCIAL HISTORY:  reports that he has quit smoking. He has never used smokeless tobacco. He reports that he does not drink alcohol or use drugs.   COVID-19 DISASTER DECLARATION:  FULL CONTACT PHYSICAL EXAMINATION WAS NOT POSSIBLE DUE TO TREATMENT OF COVID-19 AND  CONSERVATION OF PERSONAL PROTECTIVE EQUIPMENT, LIMITED EXAM FINDINGS INCLUDE-  Patient assessed or the symptoms described in the history of present illness.  In the context of the Global COVID-19   pandemic, which necessitated consideration that the patient might be at risk for infection with the SARS-CoV-2 virus that causes COVID-19, Institutional protocols and algorithms that pertain  to the evaluation of patients at risk for COVID-19 are in a state of rapid change based on information released by regulatory bodies including the CDC and federal and state organizations. These policies and algorithms were followed during the patient's care while in hospital.  REVIEW OF SYSTEMS:  Pt denies all complaints Constitutional: Negative for fever, chills, weight loss, malaise/fatigue and diaphoresis.  HENT: Negative for hearing loss, ear pain, nosebleeds, congestion, sore throat, neck pain, tinnitus and ear discharge.   Eyes: Negative for blurred vision, double vision, photophobia, pain, discharge and redness.  Respiratory: Negative for cough, hemoptysis, sputum production, shortness of breath, wheezing and stridor.   Cardiovascular: Negative for chest pain, palpitations, orthopnea, claudication, leg swelling and PND.  Gastrointestinal: Negative for heartburn, nausea, vomiting, abdominal pain, diarrhea, constipation, blood in stool and melena.  Genitourinary: Negative for dysuria, urgency, frequency, hematuria and flank pain.  Musculoskeletal: Negative for myalgias, back pain, joint pain and falls.  Skin: Negative for itching and rash.  Neurological: Negative for dizziness, tingling, tremors, sensory change, speech change, focal weakness, seizures, loss of consciousness, weakness and headaches.  Endo/Heme/Allergies: Negative for environmental allergies and polydipsia. Does not bruise/bleed easily.  SUBJECTIVE:  Pt denies all complaints On room air Levophed 28 mcg infusing through PIV  VITAL SIGNS: Temp:  [98.8 F (37.1 C)-102.3 F (39.1 C)] 98.8 F (37.1 C) (02/20 2000) Pulse Rate:  [110-159] 110 (02/20 2000) Resp:  [18-33] 19 (02/20 2000) BP: (56-177)/(45-149) 91/57 (02/20 2000) SpO2:  [89 %-100 %] 99 % (02/20 2000) Weight:  [81.6 kg] 81.6 kg (02/20 1116)  PHYSICAL EXAMINATION: General:  Acute on Chronically ill appearing male, cachectic, laying in bed, on room air, in  NAD Neuro:  Lethargic, arouses to voice, Oriented only to self, follows commands, no focal deficits, Hard of hearing HEENT:  Atraumatic, normocephalic, neck supple, no JVD Cardiovascular:  Tachycardia, regular rhythm, S1S2, no M/R/G Lungs:  Clear diminished to auscultation throughout, even, nonlabored, normal effort Abdomen: Soft, nontender, nondistended,no guarding or rebound tenderness, BS hypoactive Musculoskeletal:  Generalized weakness, no deformites, no edema Skin:  Warm and dry.  No obvious rashes, lesions, or ulcerations  Recent Labs  Lab 07/18/19 1120  NA 143  K 4.3  CL 100  CO2 25  BUN 19  CREATININE 2.17*  GLUCOSE 169*   Recent Labs  Lab 07/18/19 1120  HGB 15.0  HCT 47.1  WBC 3.2*  PLT 84*   DG Chest Portable 1 View  Result Date: 07/18/2019 CLINICAL DATA:  Cough, weakness, altered mental status EXAM: PORTABLE CHEST 1 VIEW COMPARISON:  None. FINDINGS: Normal heart size. Normal mediastinal contour. No pneumothorax. No pleural effusion. Lungs appear clear, with no acute consolidative airspace disease and no pulmonary edema. IMPRESSION: No active disease. Electronically Signed   By: Jason A Poff M.D.   On: 07/18/2019 11:57   CT Renal Stone Study  Result Date: 07/18/2019 CLINICAL DATA:  Flank pain with kidney stone suspected EXAM: CT ABDOMEN AND PELVIS WITHOUT CONTRAST TECHNIQUE: Multidetector CT imaging of the abdomen and pelvis was performed following the standard protocol without IV contrast. COMPARISON:  None. FINDINGS: Lower chest: Gynecomastia on both sides. Emphysematous changes with reticulation in the lower lungs that is likely atelectasis or scarring. Coronary calcification. Hepatobiliary: No focal liver abnormality.No evidence of biliary obstruction or stone. Pancreas: Unremarkable. Spleen: Unremarkable. Adrenals/Urinary Tract: Negative adrenals. No   hydronephrosis or stone. Symmetric smooth renal atrophy. Moderate distension of the bladder. Stomach/Bowel: No  obstruction. No evidence of inflammation. Mild distal colonic diverticulosis. Vascular/Lymphatic: No acute vascular abnormality. No mass or adenopathy. Reproductive:Marked prostate enlargement which is globular appearing. Presumed retraction of the right testicle into the inguinal canal, the scrotum is not completely covered. Other: No ascites or pneumoperitoneum. Motion degraded to the degree that findings could easily be obscured. Musculoskeletal: No acute abnormalities. Spondylosis and facet spurring. IMPRESSION: 1. Motion degraded CT without acute finding. No hydronephrosis or stone. 2. Marked enlargement of the prostate. Electronically Signed   By: Monte Fantasia M.D.   On: 07/18/2019 13:41    ASSESSMENT / PLAN:  Septic Shock secondary to UTI and Klebsiella pneumoniae bacteremia Elevated troponin in setting of demand ischemia vs. NSTEMI Lactic acidosis -Continuous cardiac monitoring -Maintain MAP >65 -IV Fluids (received IVF resuscitation in ED) -Vasopressors as needed to maintain MAP goal -Trend troponin until down-trending -EKG with borderline ST elevation in leads V2-V3; pt denies chest pain -Discussed with Dr. Gillermina Phy of [facility], will not anticoagulate at this time (given thrombocytopenia) -Cardiology consulted, appreciate input -Trend lactic acid until normalized  Severe sepsis secondary to UTI and Klebsiella Pneumoniae Bacteremia COVID-19 infection -Monitor fever curve -Trend WBC's and procalcitonin -Follow cultures as above -Continue Ceftriaxone -CXR negative and on no supplemental O2, will not place on Remdesivir or Steriods at this time  Thrombocytopenia, likely in setting of severe sepsis -Monitor for S/Sx of bleeding -Trend CBC -SCD's for VTE Prophylaxis -Transfuse for Hgb <7 -Transfuse platelets for platelet count <50 & active bleeding -Peripheral smear with normal platelets -Check HIT antibody and Serotonin release assay -Check DIC panel   AKI -Monitor I&O's /  urinary output -Follow BMP -Ensure adequate renal perfusion -Avoid nephrotoxic agents as able -Replace electrolytes as indicated -IV Fluids -CT Renal Stone study negative 2/20          Patient is critically ill with multiorgan failure, prognosis is guarded and he is extremely high risk for cardiac arrest and death  Recommend palliative care and transition to comfort care measures  Disposition: ICU Goals of care: DNR/DNI VTE prophylaxis: SCDs Updates: Updated patient's wife via telephone on 07/18/2019.  She confirms his DNR/DNI status, and reports they want to continue with conservative measures (IV fluids, antibiotics, vasopressors) to see if he will improve.   Darel Hong, AGACNP-BC [place] [facility] Pager: [phone]  07/18/2019, 9:13 PM

## 2019-07-19 DIAGNOSIS — N179 Acute kidney failure, unspecified: Secondary | ICD-10-CM

## 2019-07-19 DIAGNOSIS — A419 Sepsis, unspecified organism: Secondary | ICD-10-CM

## 2019-07-19 DIAGNOSIS — D696 Thrombocytopenia, unspecified: Secondary | ICD-10-CM

## 2019-07-19 DIAGNOSIS — R6520 Severe sepsis without septic shock: Secondary | ICD-10-CM

## 2019-07-19 LAB — COMPREHENSIVE METABOLIC PANEL
ALT: 24 U/L (ref 0–44)
AST: 76 U/L — ABNORMAL HIGH (ref 15–41)
Albumin: 2.6 g/dL — ABNORMAL LOW (ref 3.5–5.0)
Alkaline Phosphatase: 51 U/L (ref 38–126)
Anion gap: 11 (ref 5–15)
BUN: 22 mg/dL (ref 8–23)
CO2: 24 mmol/L (ref 22–32)
Calcium: 8 mg/dL — ABNORMAL LOW (ref 8.9–10.3)
Chloride: 106 mmol/L (ref 98–111)
Creatinine, Ser: 1.88 mg/dL — ABNORMAL HIGH (ref 0.61–1.24)
GFR calc Af Amer: 35 mL/min — ABNORMAL LOW (ref >60)
GFR calc non Af Amer: 30 mL/min — ABNORMAL LOW (ref >60)
Glucose, Bld: 77 mg/dL (ref 70–99)
Potassium: 4.6 mmol/L (ref 3.5–5.1)
Sodium: 141 mmol/L (ref 135–145)
Total Bilirubin: 0.8 mg/dL (ref 0.3–1.2)
Total Protein: 5.7 g/dL — ABNORMAL LOW (ref 6.5–8.1)

## 2019-07-19 LAB — APTT: aPTT: 42 seconds — ABNORMAL HIGH (ref 24–36)

## 2019-07-19 LAB — CBC
HCT: 41.5 % (ref 39.0–52.0)
Hemoglobin: 13.9 g/dL (ref 13.0–17.0)
MCH: 30.5 pg (ref 26.0–34.0)
MCHC: 33.5 g/dL (ref 30.0–36.0)
MCV: 91.2 fL (ref 80.0–100.0)
Platelets: 66 10*3/uL — ABNORMAL LOW (ref 150–400)
RBC: 4.55 MIL/uL (ref 4.22–5.81)
RDW: 14.2 % (ref 11.5–15.5)
WBC: 20.7 10*3/uL — ABNORMAL HIGH (ref 4.0–10.5)
nRBC: 0 % (ref 0.0–0.2)

## 2019-07-19 LAB — PROTIME-INR
INR: 1.2 (ref 0.8–1.2)
Prothrombin Time: 14.8 seconds (ref 11.4–15.2)

## 2019-07-19 LAB — SARS CORONAVIRUS 2 (TAT 6-24 HRS): SARS Coronavirus 2: POSITIVE — AB

## 2019-07-19 LAB — LACTIC ACID, PLASMA
Lactic Acid, Venous: 5.1 mmol/L (ref 0.5–1.9)
Lactic Acid, Venous: 4.2 mmol/L (ref 0.5–1.9)

## 2019-07-19 LAB — PROCALCITONIN: Procalcitonin: >150 ng/mL

## 2019-07-19 LAB — TROPONIN I (HIGH SENSITIVITY)
Troponin I (High Sensitivity): 935 ng/L (ref <18)
Troponin I (High Sensitivity): 1015 ng/L (ref <18)

## 2019-07-19 LAB — CORTISOL-AM, BLOOD: Cortisol - AM: 32 ug/dL — ABNORMAL HIGH (ref 6.7–22.6)

## 2019-07-19 LAB — FIBRIN DERIVATIVES D-DIMER (ARMC ONLY): Fibrin derivatives D-dimer (ARMC): >7500 ng/mL (FEU) — ABNORMAL HIGH (ref 0.00–499.00)

## 2019-07-19 LAB — FIBRINOGEN: Fibrinogen: 420 mg/dL (ref 210–475)

## 2019-07-19 MED ORDER — ACETAMINOPHEN 325 MG PO TABS: 650.0000 mg | ORAL_TABLET | Freq: Four times a day (QID) | ORAL | Status: DC | PRN

## 2019-07-19 MED ORDER — ACETAMINOPHEN 650 MG RE SUPP: 650.0000 mg | Freq: Four times a day (QID) | RECTAL | Status: DC | PRN

## 2019-07-19 MED ORDER — GLYCOPYRROLATE 0.2 MG/ML IJ SOLN: 0.2000 mg | INTRAMUSCULAR | Status: DC | PRN

## 2019-07-19 MED ORDER — HALOPERIDOL LACTATE 5 MG/ML IJ SOLN: 2.5000 mg | INTRAMUSCULAR | Status: DC | PRN

## 2019-07-19 MED ORDER — DEXTROSE 5 % IV SOLN: INTRAVENOUS | Status: DC

## 2019-07-19 MED ORDER — LORAZEPAM 2 MG/ML IJ SOLN: 2.0000 mg | INTRAMUSCULAR | Status: DC | PRN

## 2019-07-19 MED ORDER — GLYCOPYRROLATE 1 MG PO TABS
1.0000 mg | ORAL_TABLET | ORAL | Status: DC | PRN
  Filled 2019-07-19: qty 1

## 2019-07-19 MED ORDER — MORPHINE SULFATE (PF) 2 MG/ML IV SOLN: 2.0000 mg | INTRAVENOUS | Status: DC | PRN

## 2019-07-19 MED ORDER — ARGATROBAN 50 MG/50ML IV SOLN
0.5000 ug/kg/min | INTRAVENOUS | Status: DC
  Filled 2019-07-19: qty 50

## 2019-07-19 MED ORDER — POLYVINYL ALCOHOL 1.4 % OP SOLN
1.0000 [drp] | Freq: Four times a day (QID) | OPHTHALMIC | Status: DC | PRN
  Filled 2019-07-19: qty 15

## 2019-07-19 NOTE — Progress Notes (Signed)
Argatroban has NOT been started yet due to attempts for central line placement.  Called and discussed with Dr. Benjamin Stain of [facility] to confer about whether we should start the Argatroban given the pt's thrombocytopenia.  After he reviewed EKG and troponin, Dr. Benjamin Stain does not recommend anticoagulation, therefore order discontinued.  Recommends medical management.      Harlon Ditty, AGACNP-BC [place] [facility] Pager: [phone]

## 2019-07-19 NOTE — Progress Notes (Signed)
1        [place] at [facility]   PATIENT NAME: Jernard Reiber    MR#:  [id number]  DATE OF BIRTH:  08-21-1923  SUBJECTIVE:  CHIEF COMPLAINT:   Chief Complaint  Patient presents with  . Altered Mental Status  . Weakness  Nonverbal, blind, chronically ill looking, critically sick REVIEW OF SYSTEMS:  ROS unable to obtain due to critical illness DRUG ALLERGIES:   Allergies  Allergen Reactions  . Benadryl [Diphenhydramine Hcl (Sleep)] Rash   VITALS:  Blood pressure (!) 130/107, pulse 92, temperature 98.6 F (37 C), temperature source Axillary, resp. rate (!) 22, height 5\' 7"  (1.702 m), weight 81.6 kg, SpO2 98 %. PHYSICAL EXAMINATION:  Physical Exam  General:  chronically ill appearing, cachectic in no acute distress, hard of hearing Eyes:  Blind ENMT: Mucous membranes are dry. Posterior pharynx clear of any exudate or lesions.edentulous Neck: normal, supple, no masses, no thyromegaly Respiratory: clear to auscultation bilaterally, no wheezing, no crackles. Normal respiratory effort. No accessory muscle use.  Cardiovascular: Regular rate and rhythm, no murmurs / rubs / gallops. No extremity edema.  Abdomen: no tenderness, no masses palpated. No hepatosplenomegaly. Bowel sounds positive.  Musculoskeletal: no clubbing / cyanosis. No joint deformity upper and lower extremities. Good ROM, no contractures. Normal muscle tone.  Skin: no rashes, lesions, ulcers. No induration Neurologic: CN 2-12 grossly intact. Pt moving all four ext.   Awake and oriented to self only.  Follows simple commands Psychiatric:   Normal mood LABORATORY PANEL:  Male CBC Recent Labs  Lab 07/19/19 0506  WBC 20.7*  HGB 13.9  HCT 41.5  PLT 66*   ------------------------------------------------------------------------------------------------------------------ Chemistries  Recent Labs  Lab 07/19/19 0506  NA 141  K 4.6  CL 106  CO2 24  GLUCOSE 77  BUN 22  CREATININE 1.88*  CALCIUM  8.0*  AST 76*  ALT 24  ALKPHOS 51  BILITOT 0.8   RADIOLOGY:  CT Renal Stone Study  Result Date: 07/18/2019 CLINICAL DATA:  Flank pain with kidney stone suspected EXAM: CT ABDOMEN AND PELVIS WITHOUT CONTRAST TECHNIQUE: Multidetector CT imaging of the abdomen and pelvis was performed following the standard protocol without IV contrast. COMPARISON:  None. FINDINGS: Lower chest: Gynecomastia on both sides. Emphysematous changes with reticulation in the lower lungs that is likely atelectasis or scarring. Coronary calcification. Hepatobiliary: No focal liver abnormality.No evidence of biliary obstruction or stone. Pancreas: Unremarkable. Spleen: Unremarkable. Adrenals/Urinary Tract: Negative adrenals. No hydronephrosis or stone. Symmetric smooth renal atrophy. Moderate distension of the bladder. Stomach/Bowel: No obstruction. No evidence of inflammation. Mild distal colonic diverticulosis. Vascular/Lymphatic: No acute vascular abnormality. No mass or adenopathy. Reproductive:Marked prostate enlargement which is globular appearing. Presumed retraction of the right testicle into the inguinal canal, the scrotum is not completely covered. Other: No ascites or pneumoperitoneum. Motion degraded to the degree that findings could easily be obscured. Musculoskeletal: No acute abnormalities. Spondylosis and facet spurring. IMPRESSION: 1. Motion degraded CT without acute finding. No hydronephrosis or stone. 2. Marked enlargement of the prostate. Electronically Signed   By: 07/20/2019 M.D.   On: 07/18/2019 13:41   ASSESSMENT AND PLAN:  Mr. Dugal is a 86 year old male with a past medical history notable for stroke, hypertension, diabetes mellitus 2 who presented to [facility] ED on 07/18/2019 due to poor p.o. intake for about a week.  His wife reports he has not been able to swallow well for about a month, and that he is requiring total care at home she  has an aide  Septic Shock secondary to UTI and Klebsiella pneumoniae  bacteremia, COVID-19 infection -present on admission Elevated troponin likely due to demand ischemia  Lactic acidosis -Conservative management per PCCM discussion with wife.  Please note I tried calling his wife twice on her number listed on the computer with no luck -Maintain MAP >65 -Continue IV hydration -Vasopressors/Levophed as needed to maintain MAP goal -Cardiology not planning any invasive evaluation and do not recommend heparin. - Continue Ceftriaxone -CXR negative and on no supplemental O2, no need of remdesivir or Steriods at this time  Thrombocytopenia, likely in setting of severe sepsis -Monitor for S/Sx of bleeding -Platelets 66 -SCD's for VTE Prophylaxis -Transfuse platelets for platelet count <50 & active bleeding -Peripheral smear with normal platelets -Check HIT antibody and Serotonin release assay per PCCM -Check DIC panelper PCCM  AKI -Monitor I&O's / urinary output -likely prerenal. monitor on IV fluids -Ensure adequate renal perfusion -Avoid nephrotoxic agents as able -CT Renal Stone study negative 2/20  We will keep him in stepdown for now till we can clarify goals of care with his family  Patient looks chronically ill, cachectic, blind, hard of hearing -seems appropriate for comfort care/hospice. Hopefully family will agree and will be on board.  So far I have not been able to get in touch with wife  Overall very poor prognosis, he is at high risk for multiorgan failure, cardiorespiratory failure and death  DVT prophylaxis: SCD's Family Communication: I tried calling his wife 2 times on the number listed in the computer without any luck Disposition Plan:  Came from home Discharge back either home with hospice versus palliative care depending on family goals of care discussion and palliative care involvement Barriers to DC -goals of care discussion and clinical condition  All the records are reviewed and case discussed with Care Management/Social  Worker. Management plans discussed with the patient, nursing and they are in agreement.  CODE STATUS: DNR  TOTAL TIME TAKING CARE OF THIS PATIENT: 35 minutes.   More than 50% of the time was spent in counseling/coordination of care: YES  POSSIBLE D/C IN 2-3 DAYS, DEPENDING ON CLINICAL CONDITION.   Max Sane M.D on 07/19/2019 at 12:36 PM  [facility]   CC: Primary care physician; Administration, [facility]  Note: This dictation was prepared with Diplomatic Services operational officer dictation along with smaller Company secretary. Any transcriptional errors that result from this process are unintentional.

## 2019-07-19 NOTE — Progress Notes (Signed)
Finally I was able to get in touch with his wife at her correct phone number -> [phone]  She is in agreement to keep him full comfort care.  She is also in agreement for hospice and would like to place him at hospice home if he qualifies.  She is unable to take care of him at home.

## 2019-07-19 NOTE — Progress Notes (Signed)
Attempted to place right femoral CVC.  Able to successfully cannulate right femoral vein, however unable to thread the guidewire.  Angle of entry changed and still unable to thread guidewire.  Attempt aborted.  Pressure held at site.  No signs of bleeding or complications. No change in vitals.  Continue to assess.   Harlon Ditty, AGACNP-BC [place] [facility] Pager: [phone]

## 2019-07-19 NOTE — Progress Notes (Signed)
ANTICOAGULATION CONSULT NOTE - Initial Consult  Pharmacy Consult for Argatroban Indication: chest pain/ACS  Allergies  Allergen Reactions  . Benadryl [Diphenhydramine Hcl (Sleep)] Rash    Patient Measurements: Height: 5\' 7"  (170.2 cm) Weight: 180 lb (81.6 kg) IBW/kg (Calculated) : 66.1 HEPARIN DW (KG): 81.6  Vital Signs: Temp: 98.8 F (37.1 C) (02/20 2000) Temp Source: Axillary (02/20 2000) BP: 103/63 (02/20 2330) Pulse Rate: 110 (02/20 2330)  Labs: Recent Labs    07/18/19 1120 07/18/19 1221 07/18/19 1915 07/18/19 2059 07/18/19 2309 07/19/19 0002  HGB 15.0  --   --   --   --   --   HCT 47.1  --   --   --   --   --   PLT 84*  --   --   --   --   --   APTT  --   --   --   --   --  42*  LABPROT  --  14.6  --   --   --  14.8  INR  --  1.2  --   --   --  1.2  CREATININE 2.17*  --   --   --   --   --   TROPONINIHS  --   --  956* 1,065* 1,110*  --    Estimated Creatinine Clearance: 21.7 mL/min (A) (by C-G formula based on SCr of 2.17 mg/dL (H)).  Medical History: Past Medical History:  Diagnosis Date  . Diabetes mellitus without complication (HCC)   . Glaucoma   . Hypertension   . Legally blind   . Stroke [facility])     Medications:  No medications prior to admission.    Assessment: Asked to start Argatroban for ACS.  PLTs 84k on admission.  [facility] NP, will check for HIT.  Goal of Therapy:  aPTT 50-90 seconds Monitor platelets by anticoagulation protocol: Yes   Plan:  Start Argatroban 0.77mcg/kg/min, check aPTT in 2 hours  4m A 07/19/2019,12:42 AM

## 2019-07-19 NOTE — Progress Notes (Signed)
I tried to call the number listed in the computer for spouse but so far no luck.

## 2019-07-19 NOTE — TOC Initial Note (Addendum)
Transition of Care [facility]) - Initial/Assessment Note    Patient Details  Name: Tyrone Hudson MRN: [id number] Date of Birth: Jun 17, 1923  Transition of Care [facility]) CM/SW Contact:    Verna Czech [place], [place] Phone Number: 07/19/2019, 2:47 PM  Clinical Narrative:    Patient is a 86 year old male with medical history significant of DM II / HTN / stroke. Patient positive for COVID-19.  Attending doctor recommending residential hospice for patient. Patient's spouse contacted, and agrees with plan for residential hospice care. Patient's spouse chooses Physicist, medical in [place].   Phone call to [employer], spoke with Jesusita Oka. Referral faxed to [phone] and [phone].  Phone number given to patient's spouse for COVID testing through [facility] [phone].   [place], [place] Clinical Social Wor [phone]         Expected Discharge Plan: Hospice Medical Facility Barriers to Discharge: No Barriers Identified   Patient Goals and CMS Choice        Expected Discharge Plan and Services Expected Discharge Plan: Hospice Medical Facility In-house Referral: Clinical Social Work     Living arrangements for the past 2 months: Single Family Home                                      Prior Living Arrangements/Services Living arrangements for the past 2 months: Single Family Home Lives with:: Spouse          Need for Family Participation in Patient Care: Yes (Comment) Care giver support system in place?: Yes (comment) Current home services: Homehealth aide Criminal Activity/Legal Involvement Pertinent to Current Situation/Hospitalization: No - Comment as needed  Activities of Daily Living      Permission Sought/Granted                  Emotional Assessment         Alcohol / Substance Use: Not Applicable Psych Involvement: No (comment)  Admission diagnosis:  Severe sepsis (HCC) [A41.9, R65.20] Sepsis with acute organ dysfunction and septic  shock, due to unspecified organism, unspecified type (HCC) [A41.9, R65.21] Patient Active Problem List   Diagnosis Date Noted  . Severe sepsis (HCC) 07/18/2019   PCP:  Administration, [facility] Pharmacy:   [facility] - [place], [place] - [address] [place] [address] Phone: [phone] Fax: [phone]     Social Determinants of Health (SDOH) Interventions    Readmission Risk Interventions No flowsheet data found.

## 2019-07-19 NOTE — Progress Notes (Signed)
Call placed to pt's wife Phillips Goulette to update her that her husband is positive for COVID-19, and that herself and family should quarantine and be tested themselves.  Unfortanately was unable to reach her.  Will have Dr. Jayme Cloud reach out to her again today.   Harlon Ditty, AGACNP-BC [place] [facility] Pager: [phone]

## 2019-07-20 DIAGNOSIS — U071 COVID-19: Secondary | ICD-10-CM

## 2019-07-20 DIAGNOSIS — Z515 Encounter for palliative care: Secondary | ICD-10-CM

## 2019-07-20 DIAGNOSIS — Z7189 Other specified counseling: Secondary | ICD-10-CM

## 2019-07-20 LAB — HEPARIN INDUCED PLATELET AB (HIT ANTIBODY): Heparin Induced Plt Ab: 0.151 OD (ref 0.000–0.400)

## 2019-07-20 NOTE — Progress Notes (Signed)
New referral for [facility] hospice home received from [facility].  Patient information given to referral. Writer spoke to Mrs. Lorella Nimrod via telephone to initiate education regarding hospice services, philosophy, team approach top care and current visitation policy with understanding voiced.  Plan is for transfer to the hospice home tomorrow with signed out of facility DNR in place when consents are in place. TOC Robbie Lis and attending physician Dr. Sherryll Burger updated.Will continue to follow through discharge. Thank you for the opportunity to be involved int he care of this patient and his family. Dayna Barker BSN, RN, [facility] [employer] [phone]

## 2019-07-20 NOTE — Progress Notes (Signed)
[place] at [place] NAME: Tyrone Hudson    MR#:  [id number]  DATE OF BIRTH:  29-Jul-1923  SUBJECTIVE:  CHIEF COMPLAINT:   Chief Complaint  Patient presents with  . Altered Mental Status  . Weakness  Nonverbal, blind, chronically ill looking, actively dying REVIEW OF SYSTEMS:  ROS unable to obtain due to critical illness DRUG ALLERGIES:   Allergies  Allergen Reactions  . Benadryl [Diphenhydramine Hcl (Sleep)] Rash   VITALS:  Blood pressure 107/63, pulse (!) 117, temperature 98.7 F (37.1 C), temperature source Oral, resp. rate 20, height 5\' 7"  (1.702 m), weight 81.6 kg, SpO2 100 %. PHYSICAL EXAMINATION:  Physical Exam  General:  chronically ill appearing, cachectic in no acute distress, hard of hearing,  Eyes:  Blind ENMT: Mucous membranes are dry. Posterior pharynx clear of any exudate or lesions.edentulous Neck: normal, supple, no masses, no thyromegaly Respiratory: clear to auscultation bilaterally, no wheezing, no crackles. Normal respiratory effort. No accessory muscle use.  Cardiovascular: Regular rate and rhythm, no murmurs / rubs / gallops. No extremity edema.  Abdomen: no tenderness, no masses palpated. No hepatosplenomegaly. Bowel sounds positive.  Musculoskeletal: no clubbing / cyanosis. No joint deformity upper and lower extremities. Good ROM, no contractures. Normal muscle tone.  Skin: no rashes, lesions, ulcers. No induration Neurologic:  Nonfocal exam Psychiatric:   Normal mood LABORATORY PANEL:  Male CBC Recent Labs  Lab 07/19/19 0506  WBC 20.7*  HGB 13.9  HCT 41.5  PLT 66*   ------------------------------------------------------------------------------------------------------------------ Chemistries  Recent Labs  Lab 07/19/19 0506  NA 141  K 4.6  CL 106  CO2 24  GLUCOSE 77  BUN 22  CREATININE 1.88*  CALCIUM 8.0*  AST 76*  ALT 24  ALKPHOS 51  BILITOT 0.8   RADIOLOGY:  No results found. ASSESSMENT  AND PLAN:  Mr. Tyrone Hudson is a 86 year old male with a past medical history notable for stroke, hypertension, diabetes mellitus 2 who presented to [facility] ED on 07/18/2019 due to poor p.o. intake for about a week.  His wife reports he has not been able to swallow well for about a month, and that he is requiring total care at home she has an aide  Septic Shock secondary to UTI and Klebsiella pneumoniae bacteremia, COVID-19 infection -present on admission  Thrombocytopenia, likely in setting of severe sepsis  AKI   Patient is full comfort care after my discussion with his wife Tyrone Hudson over phone [phone]   Overall very poor prognosis, he is at high risk for multiorgan failure, cardiorespiratory failure and death  DVT prophylaxis: SCD's Family Communication: Had discussed with wife Tyrone Hudson over phone [phone] on 2/21.  I tried calling her today but she did not pick up Disposition Plan:  Came from home Discharge to hospice home once family is able to sign physical paper/consent to get him to hospice home Barriers to DC -no one is available to sign physical consent form to get him to hospice home as his wife is getting tested for Covid today which will result tomorrow  All the records are reviewed and case discussed with Care Management/Social Worker. Management plans discussed with the patient, nursing and they are in agreement.  CODE STATUS: DNR  TOTAL TIME TAKING CARE OF THIS PATIENT: 35 minutes.   More than 50% of the time was spent in counseling/coordination of care: YES  POSSIBLE D/C IN 1 DAYS, DEPENDING ON CLINICAL CONDITION.   Max Sane M.D on  07/20/2019 at 2:09 PM  [facility]   CC: Primary care physician; Administration, Veterans  Note: This dictation was prepared with Nurse, children's dictation along with smaller Lobbyist. Any transcriptional errors that result from this process are unintentional.

## 2019-07-20 NOTE — Consult Note (Signed)
Consultation Note Date: 07/20/2019   Patient Name: Tyrone Hudson  DOB: [birth year]  MRN: [id number]  Age / Sex: 86 y.o., male  PCP: Administration, [facility] Referring Physician: Max Sane, MD  Reason for Consultation: Establishing goals of care  HPI/Patient Profile: 86 y.o. male  with past medical history of blindness, stroke, HTN, DM2 admitted on 07/18/2019 with complaints of not eating. Workup revealed patient with severe sepsis related to UTI- he was additionally found to be COVID +. He was hypotensive requiring pressors that have been discontinued. Per primary team he has been made comfort care and residential hospice has been requested.    Clinical Assessment and Goals of Care: Chart reviewed. Patient in bed, appears cachetic. Complains of pain on his heel. He is not eating. States he isn't hungry.  Noted referral has been made to residential Hospice and this has been discussed with his wife.    Primary Decision Maker NEXT OF KIN- patient's spouse    SUMMARY OF RECOMMENDATIONS -DNR -Comfort measures as ordered -Referral for d/c to resdential Hospice    Code Status/Advance Care Planning:  DNR  Prognosis:    < 2 weeks in the setting of sepsis UTI with plans for comfort care  Discharge Planning: Hospice facility  Primary Diagnoses: Present on Admission: . Severe sepsis ([place])   I have reviewed the medical record, interviewed the patient and family, and examined the patient. The following aspects are pertinent.  Past Medical History:  Diagnosis Date  . Diabetes mellitus without complication ([place])   . Glaucoma   . Hypertension   . Legally blind   . Stroke [facility])    Social History   Socioeconomic History  . Marital status: Married    Spouse name: Not on file  . Number of children: Not on file  . Years of education: Not on file  . Highest education level: Not on file    Occupational History  . Not on file  Tobacco Use  . Smoking status: Former Research scientist (life sciences)  . Smokeless tobacco: Never Used  Substance and Sexual Activity  . Alcohol use: No  . Drug use: No  . Sexual activity: Not on file  Other Topics Concern  . Not on file  Social History Narrative  . Not on file   Social Determinants of Health   Financial Resource Strain:   . Difficulty of Paying Living Expenses: Not on file  Food Insecurity:   . Worried About Charity fundraiser in the Last Year: Not on file  . Ran Out of Food in the Last Year: Not on file  Transportation Needs:   . Lack of Transportation (Medical): Not on file  . Lack of Transportation (Non-Medical): Not on file  Physical Activity:   . Days of Exercise per Week: Not on file  . Minutes of Exercise per Session: Not on file  Stress:   . Feeling of Stress : Not on file  Social Connections:   . Frequency of Communication with Friends and Family: Not on file  . Frequency  of Social Gatherings with Friends and Family: Not on file  . Attends Religious Services: Not on file  . Active Member of Clubs or Organizations: Not on file  . Attends Banker Meetings: Not on file  . Marital Status: Not on file   History reviewed. No pertinent family history. Scheduled Meds: Continuous Infusions: . dextrose 20 mL/hr at 07/19/19 1922   PRN Meds:.acetaminophen **OR** acetaminophen, glycopyrrolate **OR** glycopyrrolate **OR** glycopyrrolate, haloperidol lactate, LORazepam, morphine injection, polyvinyl alcohol Medications Prior to Admission:  Prior to Admission medications   Not on File   Allergies  Allergen Reactions  . Benadryl [Diphenhydramine Hcl (Sleep)] Rash   Review of Systems  Physical Exam Vitals and nursing note reviewed.  Constitutional:      Appearance: He is ill-appearing.  Pulmonary:     Effort: Pulmonary effort is normal.     Vital Signs: BP 107/63 (BP Location: Left Arm)   Pulse (!) 117   Temp 98.7 F  (37.1 C) (Oral)   Resp 20   Ht 5\' 7"  (1.702 m)   Wt 81.6 kg   SpO2 100%   BMI 28.19 kg/m  Pain Scale: 0-10   Pain Score: 0-No pain   SpO2: SpO2: 100 % O2 Device:SpO2: 100 % O2 Flow Rate: .   IO: Intake/output summary:   Intake/Output Summary (Last 24 hours) at 07/20/2019 1404 Last data filed at 07/20/2019 0900 Gross per 24 hour  Intake --  Output 500 ml  Net -500 ml    LBM: Last BM Date: 07/20/19 Baseline Weight: Weight: 81.6 kg Most recent weight: Weight: 81.6 kg     Palliative Assessment/Data: PPS: 20%     Thank you for this consult. Palliative medicine will continue to follow and assist as needed.   Time In:1300 Time Out: 1400 Time Total: 60 mins Greater than 50%  of this time was spent counseling and coordinating care related to the above assessment and plan.  Signed by: 07/22/19, AGNP-C Palliative Medicine    Please contact Palliative Medicine Team phone at [phone] for questions and concerns.  For individual provider: See [phone]

## 2019-07-21 DIAGNOSIS — U071 COVID-19: Secondary | ICD-10-CM

## 2019-07-21 DIAGNOSIS — Z7189 Other specified counseling: Secondary | ICD-10-CM

## 2019-07-21 DIAGNOSIS — Z515 Encounter for palliative care: Secondary | ICD-10-CM

## 2019-07-21 LAB — CULTURE, BLOOD (ROUTINE X 2)

## 2019-07-21 LAB — URINE CULTURE: Culture: >=100000 — AB

## 2019-07-21 MED ORDER — INSULIN ASPART 100 UNIT/ML ~~LOC~~ SOLN: 10.0000 [IU] | Freq: Once | SUBCUTANEOUS | Status: DC

## 2019-07-21 MED ORDER — MORPHINE SULFATE (CONCENTRATE) 10 MG /0.5 ML PO SOLN: 5.0000 mg | ORAL | 0 refills | Status: AC | PRN

## 2019-07-21 MED ORDER — LORAZEPAM 2 MG/ML PO CONC: 1.0000 mg | ORAL | 0 refills | Status: AC | PRN

## 2019-07-21 NOTE — Progress Notes (Signed)
Patient is discharging to the hospice home today. He will be transported  by  [facility].  Family  aware of d/c from [facility]. Per Dayna Barker, RN leave IVs in place at discharge.

## 2019-07-21 NOTE — Discharge Summary (Signed)
5        [place] at [facility]   PATIENT NAME: Tyrone Hudson    MR#:  [id number]  DATE OF BIRTH:  09/07/1923  DATE OF ADMISSION:  07/18/2019   ADMITTING PHYSICIAN: Gertha Calkin, MD  DATE OF DISCHARGE: 07/21/2019  PRIMARY CARE PHYSICIAN: [facility], [facility]   ADMISSION DIAGNOSIS:  Severe sepsis (HCC) [A41.9, R65.20] Sepsis with acute organ dysfunction and septic shock, due to unspecified organism, unspecified type (HCC) [A41.9, R65.21] DISCHARGE DIAGNOSIS:  Principal Problem:   Severe sepsis (HCC) Active Problems:   COVID-19   Palliative care by specialist   Advanced care planning/counseling discussion  SECONDARY DIAGNOSIS:   Past Medical History:  Diagnosis Date  . Diabetes mellitus without complication (HCC)   . Glaucoma   . Hypertension   . Legally blind   . Stroke [facility])    HOSPITAL COURSE:  Tyrone Hudson is a 86 year old male with a past medical history notable for stroke, hypertension, diabetes mellitus admitted on 07/18/2019 due to poor p.o. intake.His wife reported he has not been able to swallow well for about a month,and that he is requiring total care at home she has an aide  Septic Shock secondary to UTIand Klebsiella pneumoniaebacteremia, COVID-19 infection -present on admission Thrombocytopenia, likely in setting of severe sepsis AKI  Patient is actively dying.  Wife is in agreement with full comfort care and transfer to hospice home. DISCHARGE CONDITIONS:  Fair CONSULTS OBTAINED:   DRUG ALLERGIES:   Allergies  Allergen Reactions  . Benadryl [Diphenhydramine Hcl (Sleep)] Rash   DISCHARGE MEDICATIONS:   Allergies as of 07/21/2019      Reactions   Benadryl [diphenhydramine Hcl (sleep)] Rash      Medication List    TAKE these medications   LORazepam 2 MG/ML concentrated solution Commonly known as: ATIVAN Take 0.5 mLs (1 mg total) by mouth every 4 (four) hours as needed for anxiety.   morphine CONCENTRATE 10 mg / 0.5 ml  concentrated solution Take 0.25 mLs (5 mg total) by mouth every 2 (two) hours as needed for severe pain.      DISCHARGE INSTRUCTIONS:   DIET:  Pleasure feeding.   DISCHARGE CONDITION:  Serious ACTIVITY:  Activity as tolerated OXYGEN:  Home Oxygen: No.  Oxygen Delivery: room air DISCHARGE LOCATION:  Hospice home  If you experience worsening of your admission symptoms, develop shortness of breath, life threatening emergency, suicidal or homicidal thoughts you must seek medical attention immediately by calling 911 or calling your MD immediately  if symptoms less severe.  You Must read complete instructions/literature along with all the possible adverse reactions/side effects for all the Medicines you take and that have been prescribed to you. Take any new Medicines after you have completely understood and accpet all the possible adverse reactions/side effects.   Please note  You were cared for by a hospitalist during your hospital stay. If you have any questions about your discharge medications or the care you received while you were in the hospital after you are discharged, you can call the unit and asked to speak with the hospitalist on call if the hospitalist that took care of you is not available. Once you are discharged, your primary care physician will handle any further medical issues. Please note that NO REFILLS for any discharge medications will be authorized once you are discharged, as it is imperative that you return to your primary care physician (or establish a relationship with a primary care physician if you do not  have one) for your aftercare needs so that they can reassess your need for medications and monitor your lab values.    On the day of Discharge:  VITAL SIGNS:  Blood pressure 121/76, pulse (!) 114, temperature 98.2 F (36.8 C), resp. rate (!) 22, height 5\' 7"  (1.702 m), weight 81.6 kg, SpO2 99 %. PHYSICAL EXAMINATION:  GENERAL:  86 y.o.-year-old patient lying  in the bed -actively dying General:chronically ill appearing, cachectic in no acute distress, hard of hearing  Eyes: Blind ENMT:Mucous membranes are dry. Posterior pharynx clear of any exudate or lesions.edentulous Neck:normal, supple, no masses, no thyromegaly Respiratory:Cheyne-Stokes respiration Cardiovascular:Regular rate and rhythm, no murmurs / rubs / gallops. No extremity edema.  Abdomen:no tenderness, no masses palpated. No hepatosplenomegaly. Bowel sounds positive.  Musculoskeletal:no clubbing / cyanosis. No joint deformity upper and lower extremities. Good ROM, no contractures. Normal muscle tone.  Skin:no rashes, lesions, ulcers. No induration Neurologic: Nonfocal exam Psychiatric: Normal mood DATA REVIEW:   CBC Recent Labs  Lab 07/19/19 0506  WBC 20.7*  HGB 13.9  HCT 41.5  PLT 66*    Chemistries  Recent Labs  Lab 07/19/19 0506  NA 141  K 4.6  CL 106  CO2 24  GLUCOSE 77  BUN 22  CREATININE 1.88*  CALCIUM 8.0*  AST 76*  ALT 24  ALKPHOS 51  BILITOT 0.8      Management plans discussed with the patient, family and they are in agreement.  CODE STATUS: DNR   TOTAL TIME TAKING CARE OF THIS PATIENT: 45 minutes.    Tyrone Hudson M.D on 07/21/2019 at 8:28 AM  [facility]   CC: Primary care physician; Administration, Veterans   Note: This dictation was prepared with Diplomatic Services operational officer dictation along with smaller Company secretary. Any transcriptional errors that result from this process are unintentional.

## 2019-07-21 NOTE — Progress Notes (Signed)
Patient to discharge to the [facility] hospice home today via EMS. Consents are in place. TOC Robbie Lis and staff RN aware. Report called to the hospice home.  Charisse March to arrange non-emergent transport with signed out of facility DNR in place. Thank you. Dayna Barker BSN, RN, [facility] [employer] [phone]

## 2019-07-21 NOTE — Discharge Instructions (Signed)
Hospice Hospice is a service that is designed to provide people who are terminally ill and their families with medical, spiritual, and psychological support. Its aim is to improve your quality of life by keeping you as comfortable as possible in the final stages of life. Who will be my providers when I begin hospice care? Hospice teams often include:  A nurse.  A doctor. The hospice doctor will be available for your care, but you can include your regular doctor or nurse practitioner.  A social worker.  A counselor.  A religious leader (such as a chaplain).  A dietitian.  Therapists.  Trained volunteers who can help with care. What services does hospice provide? Hospice services can vary depending on the center or organization. Generally, they include:  Ways to keep you comfortable, such as: ? Providing care in your home or in a home-like setting. ? Working with your family and friends to help meet your needs. ? Allowing you to enjoy the support of loved ones by receiving much of your basic care from family and friends.  Pain relief and symptom management. The staff will supply all necessary medicines and equipment so that you can stay comfortable and alert enough to enjoy the company of your friends and family.  Visits or care from a nurse and doctor. This may include 24-hour on-call services.  Companionship when you are alone.  Allowing you and your family to rest. Hospice staff may do light housekeeping, prepare meals, and run errands.  Counseling. They will make sure your emotional, spiritual, and social needs are being met, as well as those needs of your family members.  Spiritual care. This will be individualized to meet your needs and your family's needs. It may involve: ? Helping you and your family understand the dying process. ? Helping you say goodbye to your family and friends. ? Performing a specific religious ceremony or ritual.  Massage.  Nutrition  therapy.  Physical and occupational therapy.  Short-term inpatient care, if something cannot be managed in the home.  Art or music therapy.  Bereavement support for grieving family members. When should hospice care begin? Most people who use hospice are believed to have less than 6 months to live.  Your family and health care providers can help you decide when hospice services should begin.  If you live longer than 6 months but your condition does not improve, your doctor may be able to approve you for continued hospice care.  If your condition improves, you may discontinue the program. What should I consider before selecting a program? Most hospice programs are run by nonprofit, independent organizations. Some are affiliated with hospitals, nursing homes, or home health care agencies. Hospice programs can take place in your home or at a hospice center, hospital, or skilled nursing facility. When choosing a hospice program, ask the following questions:  What services are available to me?  What services will be offered to my loved ones?  How involved will my loved ones be?  How involved will my health care provider be?  Who makes up the hospice care team? How are they trained or screened?  How will my pain and symptoms be managed?  If my circumstances change, can the services be provided in a different setting, such as my home or in the hospital?  Is the program reviewed and licensed by the state or certified in some other way?  What does it cost? Is it covered by insurance?  If I choose a hospice   center or nursing home, where is the hospice center located? Is it convenient for family and friends?  If I choose a hospice center or nursing home, can my family and friends visit any time?  Will you provide emotional and spiritual support?  Who can my family call with questions? Where can I learn more about hospice? You can learn about existing hospice programs in your area  from your health care providers. You can also read more about hospice online. The websites of the following organizations have helpful information:  [facility] and Palliative Care Organization [facility]): http://www.brown-buchanan.com/  National Association for [facility]): http://massey-hart.com/  Hospice Foundation of [place]): www.hospicefoundation.org  American Cancer Society (ACS): www.cancer.org  Hospice Net: www.hospicenet.org  Visiting Nurse Associations of [place] (VNAA): www.vnaa.org You may also find more information by contacting the following agencies:  A local agency on aging.  Your local [employer] chapter.  Your state's department of health or social services. Summary  Hospice is a service that is designed to provide people who are terminally ill and their families with medical, spiritual, and psychological support.  Hospice aims to improve your quality of life by keeping you as comfortable as possible in the final stages of life.  Hospice teams often include a doctor, nurse, social worker, counselor, religious leader,dietitian, therapists, and volunteers.  Hospice care generally includes medicine for symptom management, visits from doctors and nurses, physical and occupational therapy, nutrition counseling, spiritual and emotional counseling, caregiver support, and bereavement support for grieving family members.  Hospice programs can take place in your home or at a hospice center, hospital, or skilled nursing facility. This information is not intended to replace advice given to you by your health care provider. Make sure you discuss any questions you have with your health care provider. Document Revised: 02/04/2019 Document Reviewed: 06/05/2016 Elsevier Patient Education  [place].

## 2019-07-21 NOTE — Care Management Important Message (Signed)
Important Message  Patient Details  Name: Tyrone Hudson MRN: [id number] Date of Birth: Aug 12, 1923   Medicare Important Message Given:  N/A - LOS <3 / Initial given by admissions     Olegario Messier A Evan Mackie 07/21/2019, 9:50 AM

## 2019-07-21 NOTE — TOC Transition Note (Signed)
Transition of Care [facility]) - CM/SW Discharge Note   Patient Details  Name: Tyrone Hudson MRN: [id number] Date of Birth: June 26, 1923  [facility] CM/SW Contact:  Allayne Butcher, RN Phone Number: 07/21/2019, 10:23 AM   Clinical Narrative:    Patient is ready for discharge to the hospice home today.  Patient will transport via Customer service manager.  This RNCM will arrange EMS transport.  Family is aware of discharge from the hospital today.     Final next level of care: Hospice Medical Facility Barriers to Discharge: No Barriers Identified   Patient Goals and CMS Choice   CMS Medicare.gov Compare Post Acute Care list provided to:: Patient Represenative (must comment) Choice offered to / list presented to : Spouse  Discharge Placement              Patient chooses bed at: [facility]) Patient to be transferred to facility by: [facility] Name of family member notified: Wife- Kathie Rhodes Patient and family notified of of transfer: 07/21/19  Discharge Plan and Services In-house Referral: Clinical Social Work                                   Social Determinants of Health (SDOH) Interventions     Readmission Risk Interventions No flowsheet data found.

## 2019-07-27 LAB — SEROTONIN RELEASE ASSAY (SRA)
SRA .2 IU/mL UFH Ser-aCnc: 6 % (ref 0–20)
SRA 100IU/mL UFH Ser-aCnc: 1 % (ref 0–20)

## 2019-07-27 DEATH — deceased

## 2019-07-31 ENCOUNTER — Other Ambulatory Visit: Payer: Self-pay | Admitting: Internal Medicine

## 2019-07-31 DIAGNOSIS — U071 COVID-19: Secondary | ICD-10-CM

## 2019-08-13 ENCOUNTER — Other Ambulatory Visit: Payer: Self-pay | Admitting: Internal Medicine

## 2021-03-25 IMAGING — CT CT RENAL STONE PROTOCOL
2 of 8 series · 14 of 46 positions shown, 19 images · non-contrast
Comparison: None.

CLINICAL DATA: Flank pain with kidney stone suspected

EXAM:
CT ABDOMEN AND PELVIS WITHOUT CONTRAST
TECHNIQUE: Multidetector CT imaging of the abdomen and pelvis was performed
following the standard protocol without IV contrast.

[Series 2: stone full standard · axial · 0.68mm/px · z∈[-426,-96]mm · 11 of 78 slices shown, 16 images]
[im 6/78  soft-tissue]
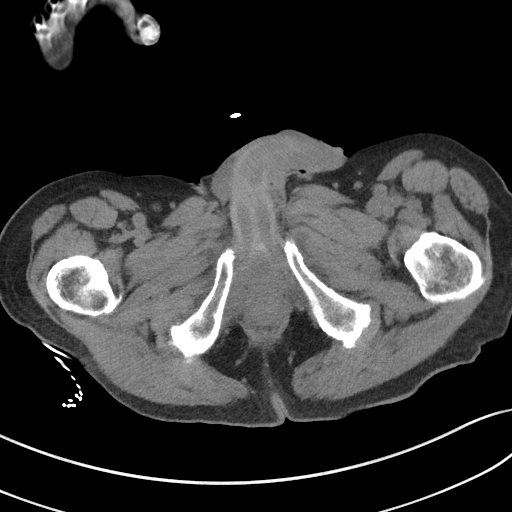
[im 6/78  bone]
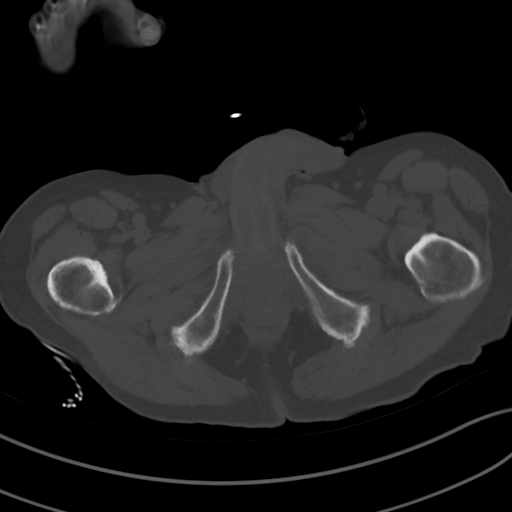
[im 12/78  soft-tissue]
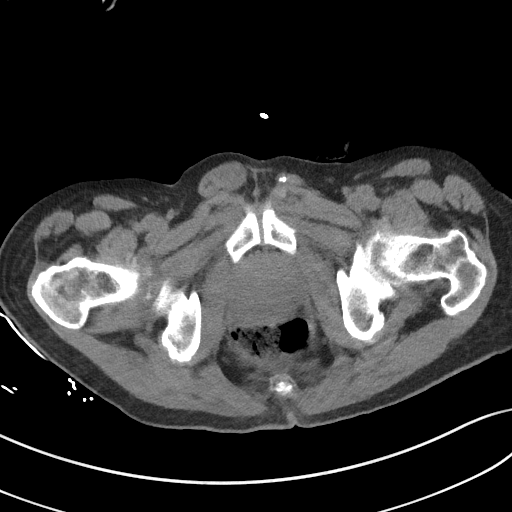
[im 23/78  soft-tissue]
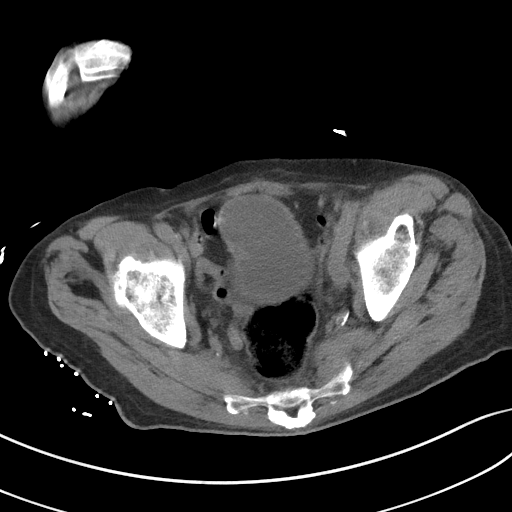
[im 28/78  soft-tissue]
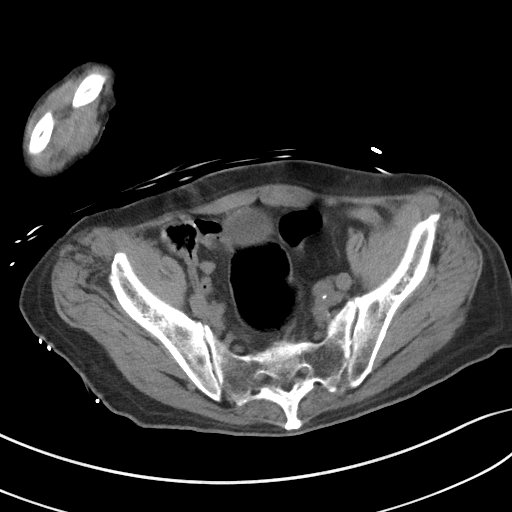
[im 34/78  soft-tissue]
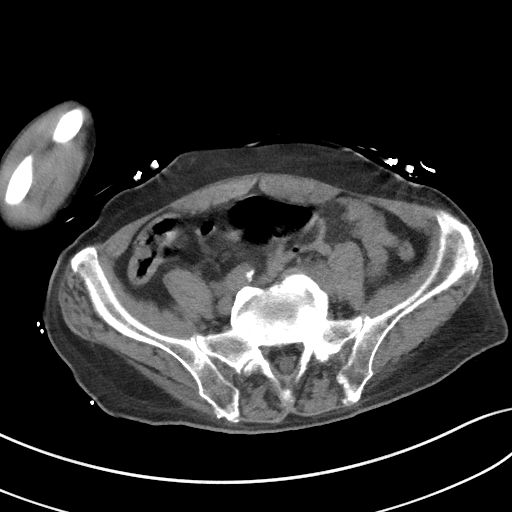
[im 45/78  soft-tissue]
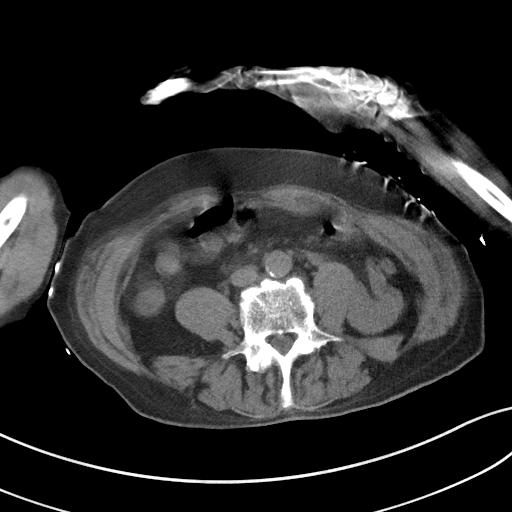
[im 50/78  soft-tissue]
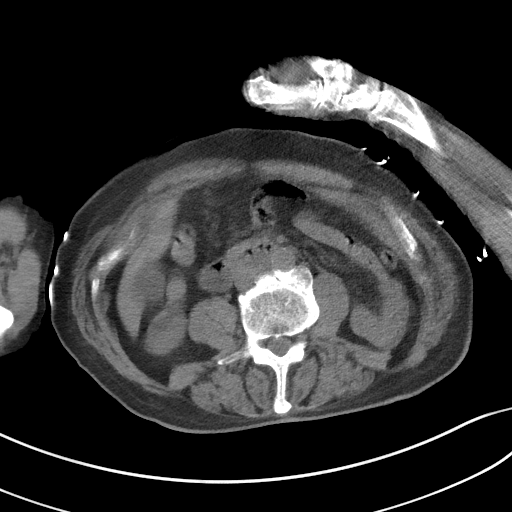
[im 56/78  soft-tissue]
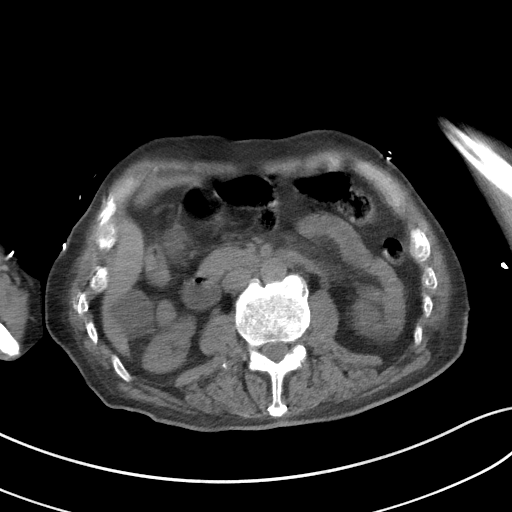
[im 56/78  lung]
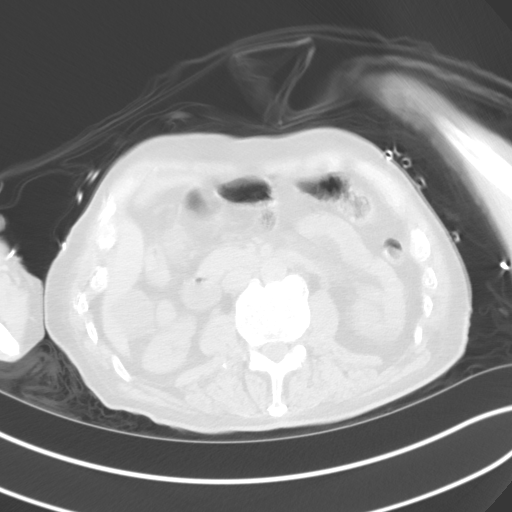
[im 61/78  lung]
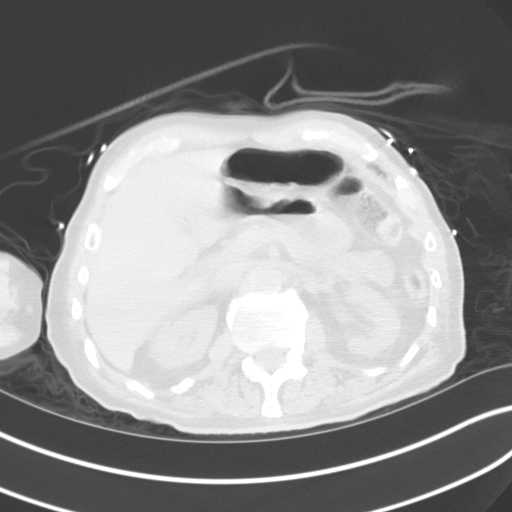
[im 67/78  soft-tissue]
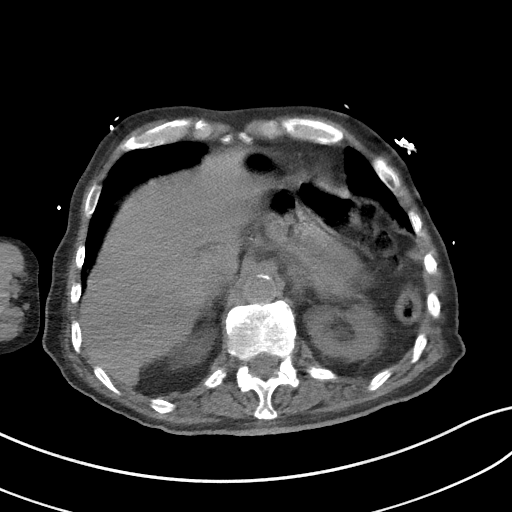
[im 67/78  lung]
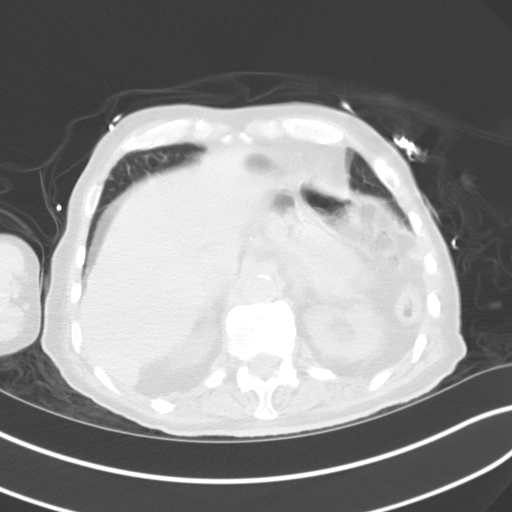
[im 67/78  bone]
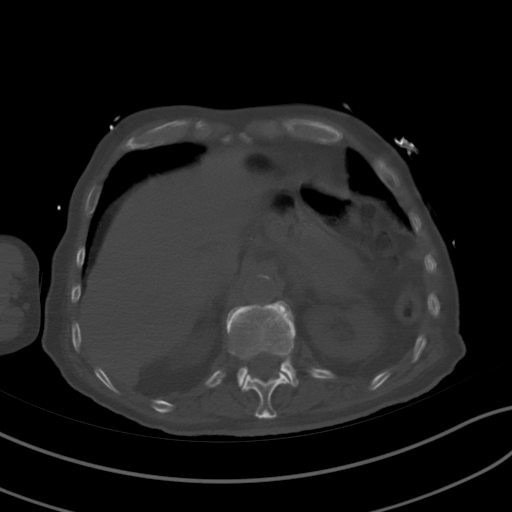
[im 72/78  soft-tissue]
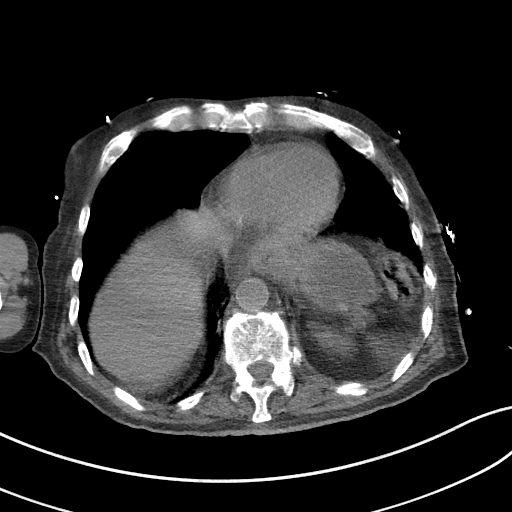
[im 72/78  lung]
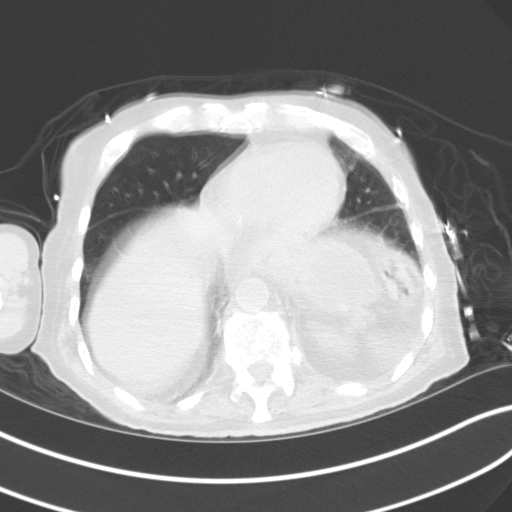

[Series 5: coronal · coronal · 0.69mm/px · 3 of 117 slices shown]
[im 30/117  soft-tissue]
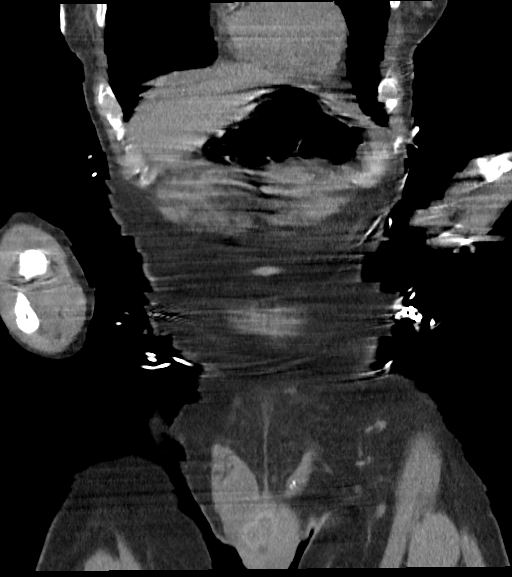
[im 59/117  soft-tissue]
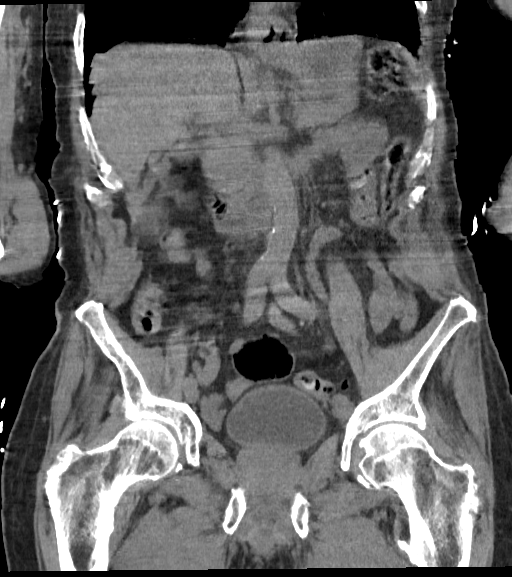
[im 88/117  soft-tissue]
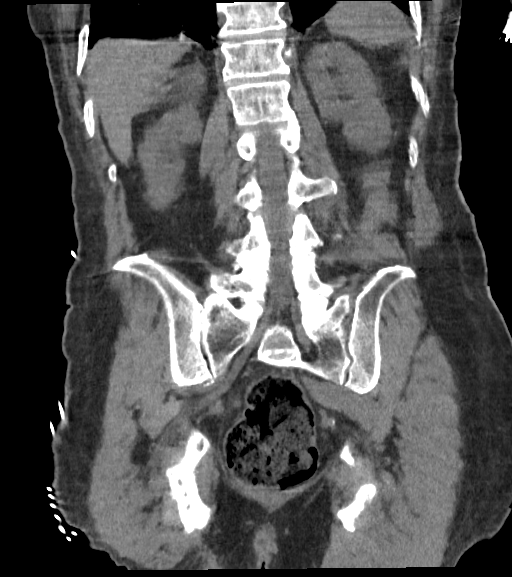

[14 of 46 positions shown; findings below may reference images not displayed]

FINDINGS: Lower chest: Gynecomastia on both sides. Emphysematous changes with
reticulation in the lower lungs that is likely atelectasis or
scarring. Coronary calcification.

Hepatobiliary: No focal liver abnormality.No evidence of biliary
obstruction or stone.

Pancreas: Unremarkable.

Spleen: Unremarkable.

Adrenals/Urinary Tract: Negative adrenals. No hydronephrosis or
stone. Symmetric smooth renal atrophy. Moderate distension of the
bladder.

Stomach/Bowel: No obstruction. No evidence of inflammation. Mild
distal colonic diverticulosis.

Vascular/Lymphatic: No acute vascular abnormality. No mass or
adenopathy.

Reproductive:Marked prostate enlargement which is globular
appearing. Presumed retraction of the right testicle into the
inguinal canal, the scrotum is not completely covered.

Other: No ascites or pneumoperitoneum.

Motion degraded to the degree that findings could easily be
obscured.

Musculoskeletal: No acute abnormalities. Spondylosis and facet
spurring.
IMPRESSION: 1. Motion degraded CT without acute finding. No hydronephrosis or
stone.
2. Marked enlargement of the prostate.

## 2021-03-25 IMAGING — DX DG CHEST 1V PORT
1 series · 1 of 1 positions shown · non-contrast
Comparison: None.

CLINICAL DATA: Cough, weakness, altered mental status

EXAM:
PORTABLE CHEST 1 VIEW

[chest ap]
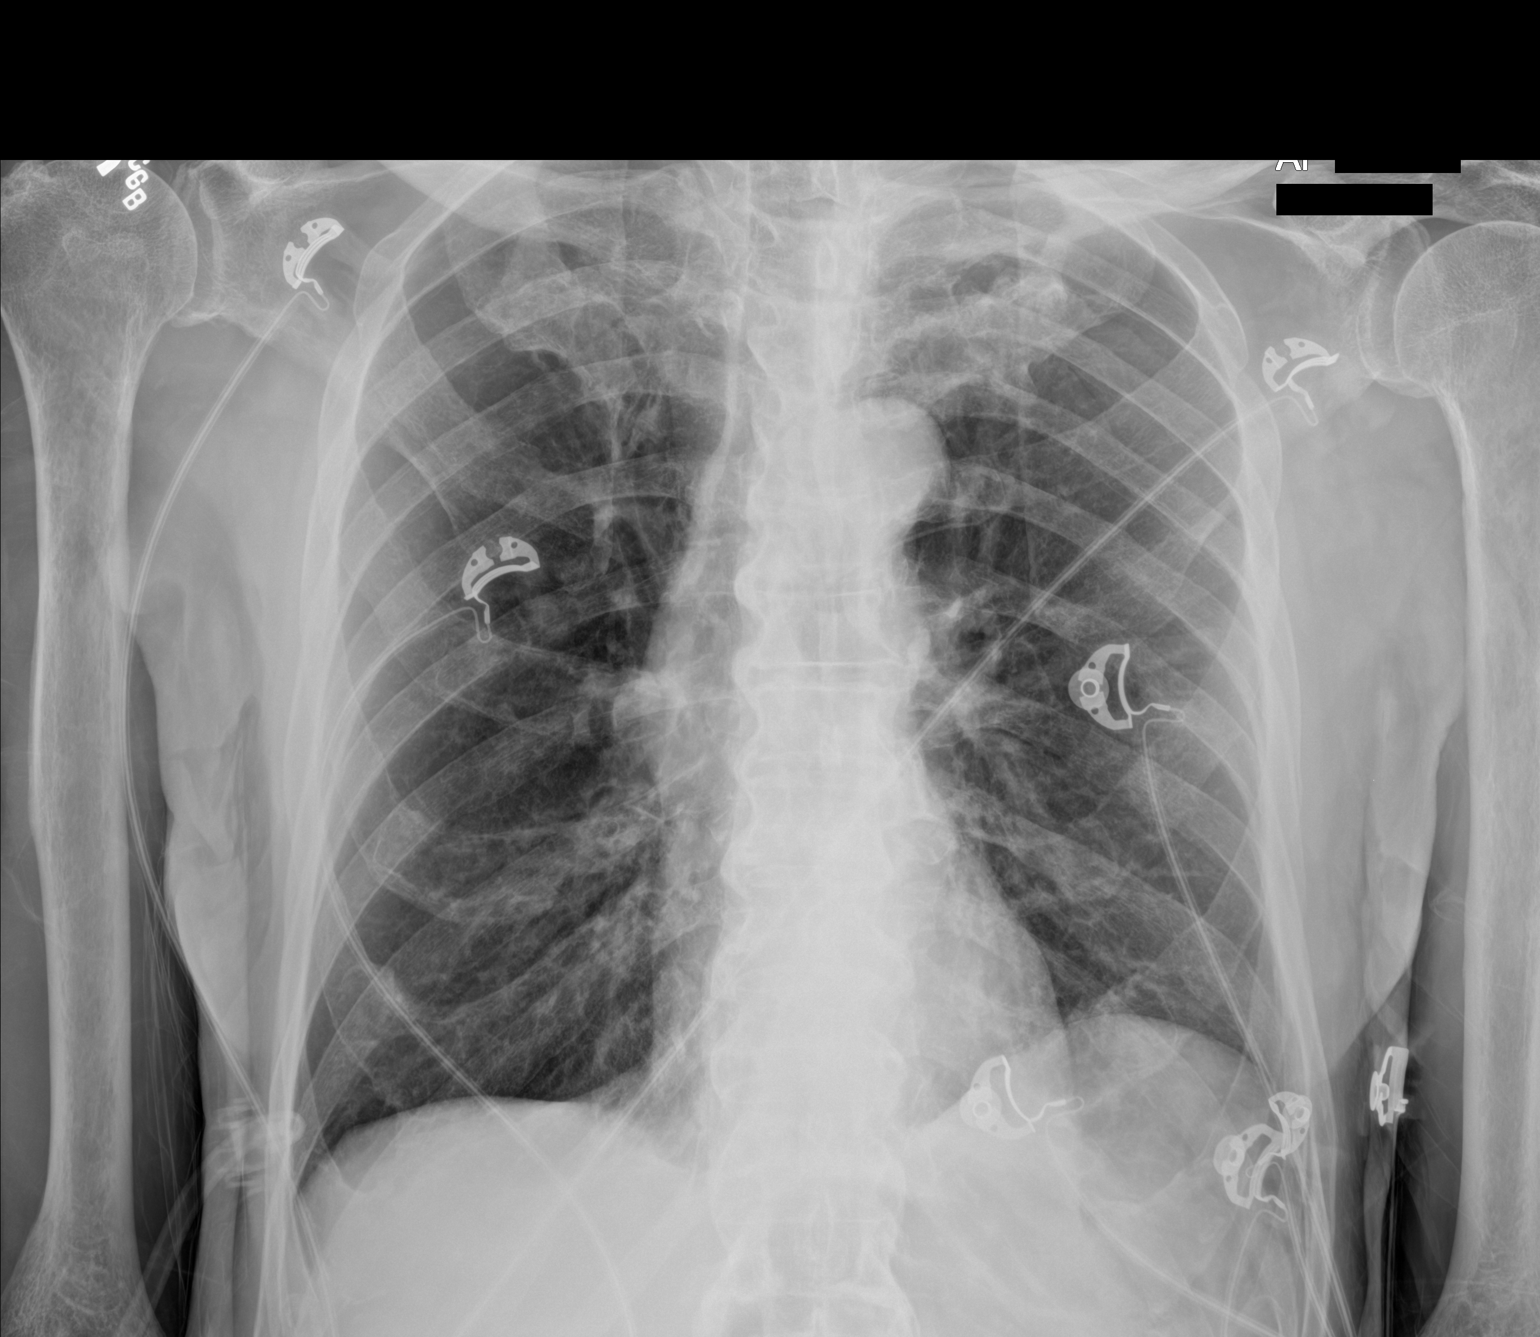

[1 of 1 positions shown; findings below may reference images not displayed]

FINDINGS: Normal heart size. Normal mediastinal contour. No pneumothorax. No
pleural effusion. Lungs appear clear, with no acute consolidative
airspace disease and no pulmonary edema.
IMPRESSION: No active disease.
